# Patient Record
Sex: Female | Born: 1990 | Race: White | Hispanic: No | Marital: Single | Smoking: Never smoker
Health system: Southern US, Community
[De-identification: ages and names within clinical notes are randomized; demographics above are authoritative.]

## PROBLEM LIST (undated history)

## (undated) DIAGNOSIS — Z8619 Personal history of other infectious and parasitic diseases: Secondary | ICD-10-CM

## (undated) DIAGNOSIS — F410 Panic disorder [episodic paroxysmal anxiety] without agoraphobia: Secondary | ICD-10-CM

## (undated) DIAGNOSIS — F419 Anxiety disorder, unspecified: Secondary | ICD-10-CM

## (undated) DIAGNOSIS — R87629 Unspecified abnormal cytological findings in specimens from vagina: Secondary | ICD-10-CM

## (undated) HISTORY — DX: Unspecified abnormal cytological findings in specimens from vagina: R87.629

## (undated) HISTORY — PX: WISDOM TOOTH EXTRACTION: SHX21

## (undated) HISTORY — PX: TONSILLECTOMY: SUR1361

## (undated) HISTORY — PX: COLPOSCOPY: SHX161

## (undated) HISTORY — PX: CYSTOSCOPY W/ DILATION OF BLADDER: SUR374

## (undated) HISTORY — PX: NO PAST SURGERIES: SHX2092

---
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
## 2009-12-02 ENCOUNTER — Ambulatory Visit: Payer: Self-pay | Admitting: Pediatrics

## 2010-04-21 LAB — POCT PREGNANCY, URINE: Preg Test, Ur: NEGATIVE

## 2010-04-21 LAB — POCT HEMOGLOBIN-HEMACUE: Hemoglobin: 12.9 g/dL (ref 12.0–15.0)

## 2010-04-27 LAB — POCT I-STAT, CHEM 8
Hemoglobin: 12.9 g/dL (ref 12.0–15.0)
Potassium: 4 mEq/L (ref 3.5–5.1)
Sodium: 138 mEq/L (ref 135–145)
TCO2: 22 mmol/L (ref 0–100)

## 2010-04-27 LAB — CBC
MCHC: 33.7 g/dL (ref 30.0–36.0)
MCV: 90.5 fL (ref 78.0–100.0)
Platelets: 215 10*3/uL (ref 150–400)
WBC: 12.4 10*3/uL — ABNORMAL HIGH (ref 4.0–10.5)

## 2010-04-27 LAB — URINALYSIS, ROUTINE W REFLEX MICROSCOPIC
Nitrite: NEGATIVE
Protein, ur: 100 mg/dL — AB
Specific Gravity, Urine: 1.012 (ref 1.005–1.030)
Urobilinogen, UA: 0.2 mg/dL (ref 0.0–1.0)

## 2010-04-27 LAB — URINE CULTURE: Colony Count: >=100000

## 2010-04-27 LAB — URINE MICROSCOPIC-ADD ON

## 2010-06-03 NOTE — Consult Note (Signed)
   NAME:  Sydney Waller, Sydney Waller                            ACCOUNT NO.:  [id number]   MEDICAL RECORD NO.:  [id number]                   PATIENT TYPE:  EMS   LOCATION:  MINO                                 FACILITY:  [facility]   PHYSICIAN:  Deanna Artis. Sharene Skeans, M.D.           DATE OF BIRTH:  12-28-1990   DATE OF CONSULTATION:  06/09/2002  DATE OF DISCHARGE:                                   CONSULTATION   LABORATORY DATA:  The patient had an EKG which showed a regular sinus rhythm  of 98 beats per minute.  No sign of prolonged Q-T interval.  She had an  ISTAT with a sodium of 139, potassium 3.6, chloride 101, BUN 11, creatinine  0.7, glucose 130.  Hemoglobin 15, hematocrit 44.  pH 7.414, PCO2 33,  bicarbonate 21.  Delta base -3.  CT scan of the brain, noncontrast was  normal.  Rapid strep screening was positive.  Prolactin level was pending at  this time.  CBC:  White count 14,900, hemoglobin 13, hematocrit 37.8, MCV  85.3, platelet count 251,000, 88 polys, 7 lymphs, 5 monos.                                                Deanna Artis. Sharene Skeans, M.D.    [facility]  D:  06/09/2002  T:  06/10/2002  Job:  [id number]

## 2010-06-03 NOTE — Consult Note (Signed)
NAME:  Sydney Waller, Sydney Waller                            ACCOUNT NO.:  [id number]   MEDICAL RECORD NO.:  [id number]                   PATIENT TYPE:  EMS   LOCATION:  MINO                                 FACILITY:  [facility]   PHYSICIAN:  Deanna Artis. Sharene Skeans, M.D.           DATE OF BIRTH:  1990-07-30   DATE OF CONSULTATION:  06/09/2002  DATE OF DISCHARGE:                                   CONSULTATION   REPORT TITLE:  NEUROLOGY CONSULTATION.   REFERRING PHYSICIAN:  Trudi Ida. Denton Lank, M.D.   CHIEF COMPLAINT:  Syncope.   HISTORY OF PRESENT ILLNESS:  I was asked to see Sydney Waller for evaluation  of a syncopal episode.  She is a 20 year old right-handed Caucasian girl who  has had an episode of presyncope about a week ago in the setting of being  extremely hot outside.  She felt woozy and fortunately was lying down and  did not fully pass out.  She had a second episode this afternoon.  She  stayed home from school with a temperature of 100.6.  She had felt very  thirsty and her mother decided to wait in the car while she went into the  store to get something to drink.  While she was in the store, she felt  lightheaded, warm, and laid her head down on the check-writing shelf.  She  then pitched backwards striking her head on the counter and slumped to the  ground.  In retrospect, she had the same feeling of nausea, warmth, and  lightheadedness before nearing passing out before.   The patient has had other periods where she felt lightheaded which have  happened when she had Streptococcal pharyngitis with high fever.  These were  relatively minor in comparison with the last two episodes.  It turns out  that she has a positive Streptococcus test this time as well.   The patient has had two other quasi-neurologic problems.  The first was  orthostatic lightheadedness that occurs when she suddenly stands or  sometimes when she has stood up for awhile.  She feels a bit woozy and then  her symptoms  subside.  This more often happens when she is sick or not  feeling well.  The other is migraine without aura.  These have been present  for a little over a year and have been associated with pounding pain that is  holocephalic, nausea, occasional vomiting, sensitivity to light, but not  sound, and incapacitation.  She often is able to treat these with Advil a  dose of 200 or 400 mg.  She can take this at school or at home.  When she  awakens with it in the morning, she takes Advil, goes back to bed, and  sleeps for 3-4 hours.  When she awakens she feels better.   Mother estimates that these are happening about once per week.  Because  Advil has controlled them,  the family has not sought further care for this  problem.   FAMILY HISTORY:  Positive for migraines, positive also for seizures in the  patient's mother.  No other history of syncope that we know of.  No other  neurologic conditions.   PAST MEDICAL HISTORY:  The patient was a full-term infant.  Normal  spontaneous vaginal delivery.  Normal growth and development.   PAST SURGICAL HISTORY:  The patient had some dental surgery done, but no  major surgery.  No fractures.  No closed head injuries until today.   REVIEW OF SYSTEMS:  Remarkable only for a low grade fever and sore throat.  The patient has had multiple episodes of Streptococcal pharyngitis with  scarlet fever which has been treated not only with antibiotics, but also  with steroids with good effect.  She has never had a tonsillectomy.  The  remainder of the total system review is negative.  Immunizations are up-to-  date.   SOCIAL HISTORY:  The patient is at [employer] in the seventh  grade.  She is a very good Consulting civil engineer.  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  There is some concern in the family that these syncopal episodes  may make it difficult for her to play this summer.   PHYSICAL EXAMINATION:  GENERAL:  This is an attractive blonde-haired,  blue-  eyed pubescent child in no distress.  VITAL SIGNS:  Blood pressure 100/60, resting pulse 113, respirations 20,  temperature 99.0, pulse oximetry 99%.  HEENT:  No signs of infection.  NECK:  Supple neck, full range of motion, no cranial or cervical bruits.  LUNGS:  Clear to auscultation.  HEART:  No murmurs, pulses normal.  ABDOMEN:  Soft, nontender, bowel sounds normal.  EXTREMITIES:  Well formed without edema, cyanosis, alterations in tone, or  tight heel cords.  NEUROLOGICAL:  Mental status:  Awake, alert, attentive, appropriate, no  dysphasia or dyspraxia.  Cranial nerves:  Round reactive pupils, normal  fundi, full visual fields to double simultaneous stimuli.  Extraocular  movements full and conjugate.  OKN responses equal bilaterally.  Symmetric  facial strength and sensation.  Air conduction greater than bone conduction  bilaterally.  Motor examination, normal strength, tone, and bulk.  Fine  motor movements:  No pronator drift, sensation intact, full vibration  stereognosis.  Cerebellar examination, good finger-to-nose, rapid  alternating movements, no finger dystaxia, or dysmetria.  Gait and station  was normal.  She is able to walk on her heels and toes and perform tandem  without difficulty.  Deep tendon reflexes are symmetrically diminished.  The  patient had bilateral flexor plantar responses.   IMPRESSION:  1. Syncope, 780.2.  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  It is based on the prodrome of     nausea, heat, and lightheadedness followed by presyncope or syncope.  She     had syncope today because she stayed upright rather than lying down as     she had a week ago.  It is unlikely that we are dealing with cardiogenic     arrhythmia, and equally unlikely that this was a seizure, and unlikely     that she had a hypoglycemic episode.  All of these have their own time    courses and behaviors, none of which  were seen in the patient.  2. Orthostatic hypotension, 458.0.  This has caused her brief episodes  of     wooziness.  This is very common in a pubescent child and should go away     over time.  3. Migraine without aura, 346.10.  These episodes are frequent enough to     qualify Aviela for prophylactic medication; however, I do not think that     the family wants her on regular medication unless she has to be.   Treatment of the neurocardiogenic syncope would involve up to two quarts of  Gatorade per day (not neglecting three glasses of milk with meals).  If that  failed to prevent the episodes, then Florinef at a dose of 0.1 mg per day  would be tried.  It should be increased to b.i.d.  If that failed, Valissa  would have to have a tilt-table test at [facility]  which, if positive, would trigger either treatment with Zoloft or possibly  propranolol.  Mother has an aversion to Zoloft having tried the medication  and had adverse effects to it.    PLAN:  We will perform an EEG at our office, set up a Holter monitor at  [facility].  If necessary, we will send her to [facility] for tilt-table  test.  I have explained this to the family and I have tried to contact her  uncle, Dr. Adrian Prince, who asked me to become involved in her care.  We  will see her back in my office depending upon her clinical course.                                               Deanna Artis. Sharene Skeans, M.D.    [facility]  D:  06/09/2002  T:  06/10/2002  Job:  [id number]   cc:   Jeannett Senior A. Evlyn Kanner, M.D.  [address]  [place]  [place] [address]  Fax: [phone]   Trudi Ida. Denton Lank, M.D.  [address]  [place] [address]  Fax: [phone]

## 2010-10-18 LAB — URINE MICROSCOPIC-ADD ON

## 2010-10-18 LAB — URINALYSIS, ROUTINE W REFLEX MICROSCOPIC
Nitrite: POSITIVE — AB
Protein, ur: NEGATIVE
Urobilinogen, UA: 2 — ABNORMAL HIGH

## 2011-01-17 NOTE — L&D Delivery Note (Signed)
Delivery Note At 2:02 AM a viable and healthy female was delivered via Vaginal, Spontaneous Delivery (Presentation: Right Occiput Anterior).  APGAR: 9, 9; weight .   Placenta status: Intact, Spontaneous.  Cord: 3 vessels with the following complications: None.  Cord pH: na [place] x one reduced on perineum..  Anesthesia: Epidural Local 1% xylocaine Episiotomy: None Lacerations: 2nd degree;Periurethral and vaginal Suture Repair: 2.0 3.0 vicryl rapide Est. Blood Loss (mL): 300  Mom to postpartum.  Baby to nursery-stable.  Rondi Ivy J 09/11/2011, 2:22 AM

## 2011-02-15 LAB — OB RESULTS CONSOLE RUBELLA ANTIBODY, IGM: Rubella: IMMUNE

## 2011-02-15 LAB — OB RESULTS CONSOLE RPR: RPR: NONREACTIVE

## 2011-02-15 LAB — OB RESULTS CONSOLE HEPATITIS B SURFACE ANTIGEN: Hepatitis B Surface Ag: NEGATIVE

## 2011-03-01 ENCOUNTER — Ambulatory Visit (HOSPITAL_COMMUNITY): Admission: RE | Admit: 2011-03-01 | Payer: Self-pay | Source: Ambulatory Visit | Admitting: Obstetrics

## 2011-03-01 ENCOUNTER — Encounter (HOSPITAL_COMMUNITY): Payer: Self-pay | Admitting: Anesthesiology

## 2011-03-01 ENCOUNTER — Encounter (HOSPITAL_COMMUNITY): Admission: RE | Payer: Self-pay | Source: Ambulatory Visit

## 2011-03-03 LAB — OB RESULTS CONSOLE GC/CHLAMYDIA: Chlamydia: NEGATIVE

## 2011-04-12 IMAGING — CR DG VCUG
2 series · 2 of 2 positions shown · non-contrast
Comparison: [HOSPITAL] abdominal pelvic CT urogram
05/14/2008.

CLINICAL DATA: Recurrent urinary tract infections.  Evaluate for
possible vesicoureteral reflux.  Last menstrual period 2 weeks ago.

CYSTOGRAM
TECHNIQUE: After catheterization of the urinary bladder following
sterile technique the bladder was filled with 300 cc Cysto-Hypaque
30% by drip infusion.  Serial spot images were obtained during
bladder filling and post draining. Initial Foley catheter with
defective balloon slipped from bladder with repeat catheterization
as above.
Fluoroscopy Time: 2.3 minutes.  Pediatric technique utilized.

[view not recorded (1 of 2)]
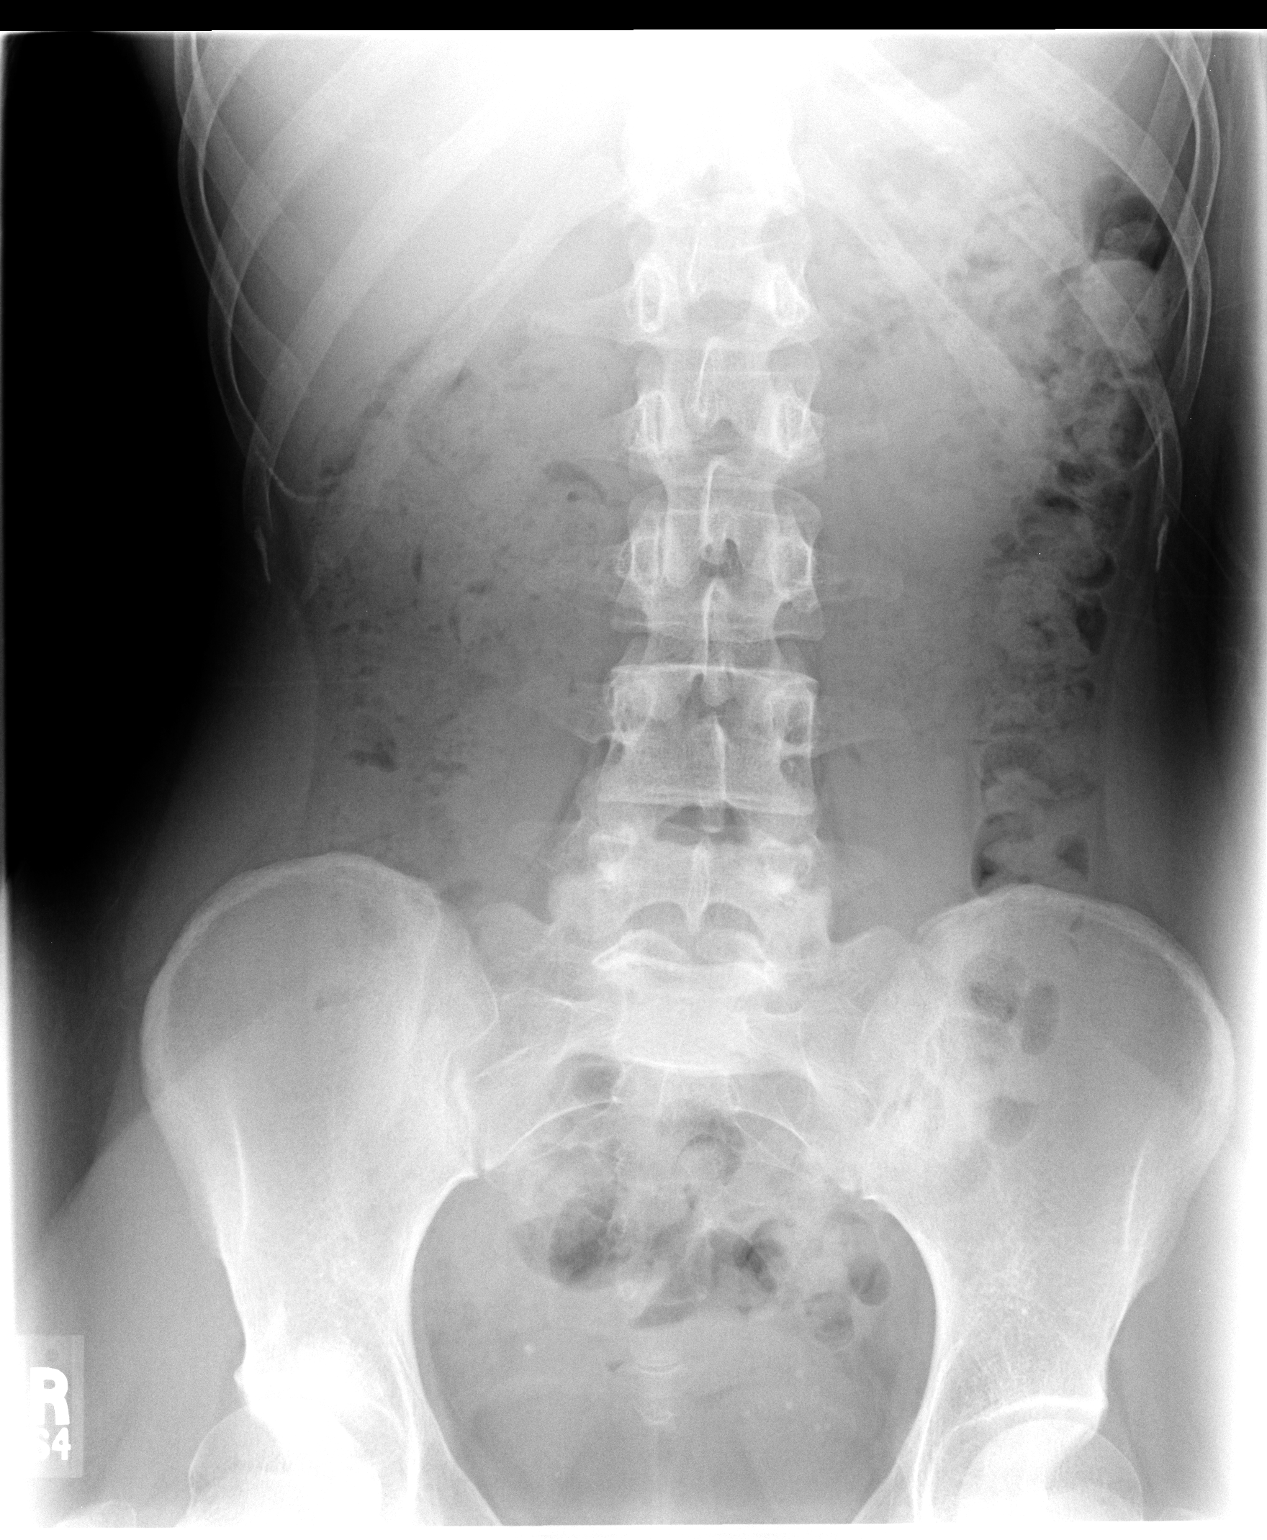

[view not recorded (2 of 2)]
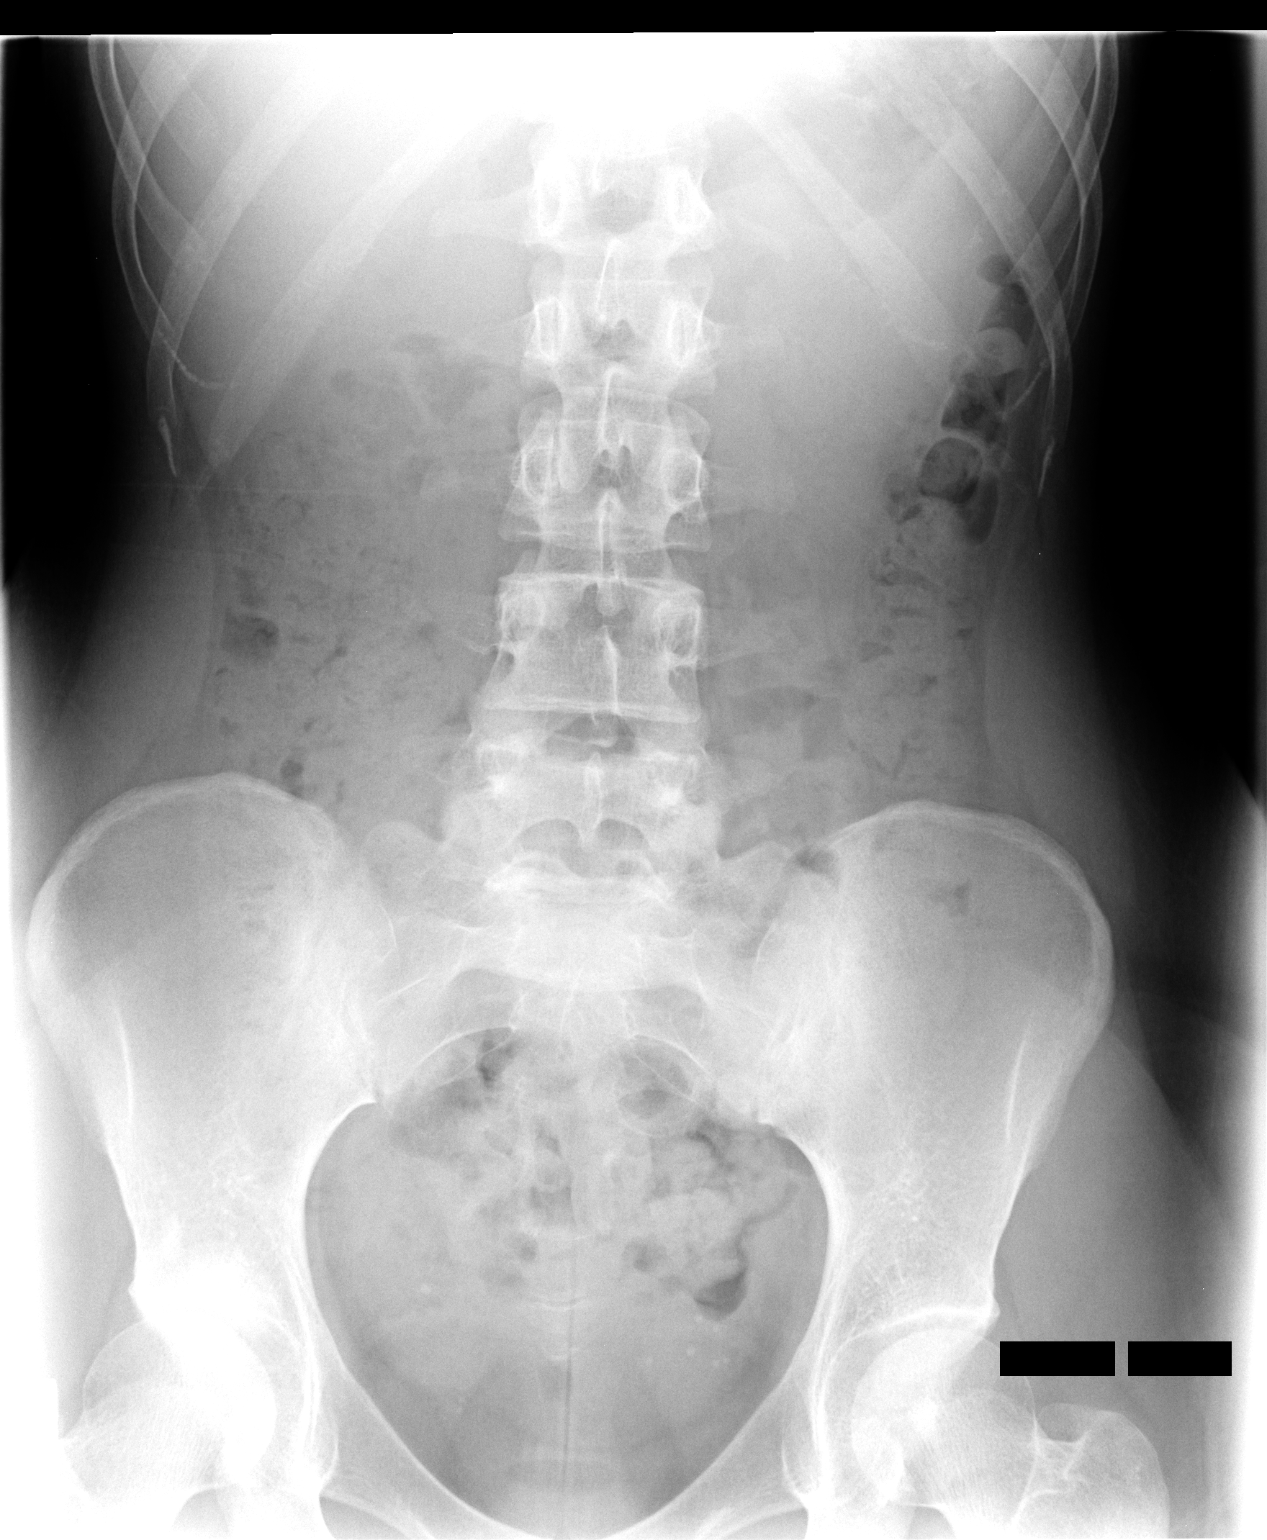

[2 of 2 positions shown; findings below may reference images not displayed]

FINDINGS: Scout view demonstrates stable moderate retained colonic
feces with normal bowel gas pattern and numerous calcified pelvic
phleboliths.  No interval urinary tract calcifications seen.
Bladder appears normal without evidence for vesicoureteral reflux.
Upon voiding the urethra appears normal.  Post void image
demonstrates no residual urinary tract contrast.
IMPRESSION: 1.  Moderate retained colonic feces may represent constipation -
need clinical correlation.
2.  Otherwise, normal.

## 2011-09-09 ENCOUNTER — Inpatient Hospital Stay (HOSPITAL_COMMUNITY)
Admission: AD | Admit: 2011-09-09 | Discharge: 2011-09-09 | Disposition: A | Discharge status: Home / Self Care | Payer: 59 | Source: Ambulatory Visit | Attending: Obstetrics and Gynecology | Admitting: Obstetrics and Gynecology

## 2011-09-09 ENCOUNTER — Encounter (HOSPITAL_COMMUNITY): Payer: Self-pay

## 2011-09-09 DIAGNOSIS — O479 False labor, unspecified: Secondary | ICD-10-CM | POA: Insufficient documentation

## 2011-09-09 NOTE — MAU Provider Note (Signed)
  History   Contractions  CSN: [id number]  Arrival date and time: 09/09/11 0227   None     Chief Complaint  Patient presents with  . Labor Eval   HPI  OB History    Grav Para Term Preterm Abortions TAB SAB Ect Mult Living   1               History reviewed. No pertinent past medical history.  Past Surgical History  Procedure Date  . Wisdom tooth extraction     History reviewed. No pertinent family history.  History  Substance Use Topics  . Smoking status: Never Smoker   . Smokeless tobacco: Not on file  . Alcohol Use: No    Allergies: No Known Allergies  Prescriptions prior to admission  Medication Sig Dispense Refill  . polyethylene glycol (MIRALAX / GLYCOLAX) packet Take 17 g by mouth daily.      . Prenatal Vit-Fe Fumarate-FA (PRENATAL MULTIVITAMIN) TABS Take 1 tablet by mouth daily.      . ranitidine (ZANTAC) 150 MG capsule Take 150 mg by mouth 2 (two) times daily.        ROS Physical Exam NCAT Lgs:CTA CV:RRR Abd: gravid, NT Neg CVAT EXT: neg c/c/e VE_ 2/70/-1 Neuro: nonfocal SkiN: intact     Blood pressure 125/68, pulse 90, temperature 97.9 F (36.6 C), temperature source Oral, resp. rate 18, height 5\' 4"  (1.626 m), weight 74.844 kg (165 lb).  Physical Exam  MAU Course  Procedures  MDM Na   Assessment and Plan  Prodromal labor Reactive NST DC home  Ronnell Makarewicz J 09/09/2011, 3:25 AM

## 2011-09-09 NOTE — MAU Note (Signed)
Patient states "I have been working all evening and have been having contractions more frequently and in my lower abdomen"

## 2011-09-10 ENCOUNTER — Inpatient Hospital Stay (HOSPITAL_COMMUNITY): Payer: 59 | Admitting: Anesthesiology

## 2011-09-10 ENCOUNTER — Encounter (HOSPITAL_COMMUNITY): Payer: Self-pay | Admitting: Anesthesiology

## 2011-09-10 ENCOUNTER — Inpatient Hospital Stay (HOSPITAL_COMMUNITY)
Admission: AD | Admit: 2011-09-10 | Discharge: 2011-09-13 | DRG: 775 | Disposition: A | Discharge status: Home / Self Care | Payer: 59 | Source: Ambulatory Visit | Attending: Obstetrics and Gynecology | Admitting: Obstetrics and Gynecology

## 2011-09-10 ENCOUNTER — Encounter (HOSPITAL_COMMUNITY): Payer: Self-pay | Admitting: *Deleted

## 2011-09-10 DIAGNOSIS — Z22330 Carrier of Group B streptococcus: Secondary | ICD-10-CM

## 2011-09-10 DIAGNOSIS — O99892 Other specified diseases and conditions complicating childbirth: Secondary | ICD-10-CM | POA: Diagnosis present

## 2011-09-10 HISTORY — DX: Panic disorder (episodic paroxysmal anxiety): F41.0

## 2011-09-10 HISTORY — DX: Personal history of other infectious and parasitic diseases: Z86.19

## 2011-09-10 HISTORY — DX: Anxiety disorder, unspecified: F41.9

## 2011-09-10 LAB — CBC
HCT: 34.6 % — ABNORMAL LOW (ref 36.0–46.0)
Hemoglobin: 11.6 g/dL — ABNORMAL LOW (ref 12.0–15.0)
MCH: 29.6 pg (ref 26.0–34.0)
MCHC: 33.5 g/dL (ref 30.0–36.0)
MCV: 88.3 fL (ref 78.0–100.0)
Platelets: 288 K/uL (ref 150–400)
RBC: 3.92 MIL/uL (ref 3.87–5.11)
RDW: 14 % (ref 11.5–15.5)
WBC: 15.3 K/uL — ABNORMAL HIGH (ref 4.0–10.5)

## 2011-09-10 MED ORDER — ONDANSETRON HCL 4 MG/2ML IJ SOLN: 4.0000 mg | Freq: Four times a day (QID) | INTRAMUSCULAR | Status: DC | PRN

## 2011-09-10 MED ORDER — LACTATED RINGERS IV SOLN: 500.0000 mL | INTRAVENOUS | Status: DC | PRN

## 2011-09-10 MED ORDER — PHENYLEPHRINE 40 MCG/ML (10ML) SYRINGE FOR IV PUSH (FOR BLOOD PRESSURE SUPPORT)
80.0000 ug | PREFILLED_SYRINGE | INTRAVENOUS | Status: DC | PRN
  Filled 2011-09-10: qty 5

## 2011-09-10 MED ORDER — EPHEDRINE 5 MG/ML INJ: 10.0000 mg | INTRAVENOUS | Status: DC | PRN

## 2011-09-10 MED ORDER — OXYTOCIN 40 UNITS IN LACTATED RINGERS INFUSION - SIMPLE MED
1.0000 m[IU]/min | INTRAVENOUS | Status: DC
  Administered 2011-09-10: 2 m[IU]/min via INTRAVENOUS
  Filled 2011-09-10: qty 1000

## 2011-09-10 MED ORDER — LIDOCAINE HCL (PF) 1 % IJ SOLN
30.0000 mL | INTRAMUSCULAR | Status: DC | PRN
  Filled 2011-09-10: qty 30

## 2011-09-10 MED ORDER — IBUPROFEN 600 MG PO TABS: 600.0000 mg | ORAL_TABLET | Freq: Four times a day (QID) | ORAL | Status: DC | PRN

## 2011-09-10 MED ORDER — CITRIC ACID-SODIUM CITRATE 334-500 MG/5ML PO SOLN: 30.0000 mL | ORAL | Status: DC | PRN

## 2011-09-10 MED ORDER — FENTANYL 2.5 MCG/ML BUPIVACAINE 1/10 % EPIDURAL INFUSION (WH - ANES)
14.0000 mL/h | INTRAMUSCULAR | Status: DC
  Administered 2011-09-11: 14 mL/h via EPIDURAL
  Filled 2011-09-10 (×2): qty 60

## 2011-09-10 MED ORDER — EPHEDRINE 5 MG/ML INJ
10.0000 mg | INTRAVENOUS | Status: DC | PRN
  Filled 2011-09-10: qty 4

## 2011-09-10 MED ORDER — DIPHENHYDRAMINE HCL 50 MG/ML IJ SOLN: 12.5000 mg | INTRAMUSCULAR | Status: DC | PRN

## 2011-09-10 MED ORDER — PHENYLEPHRINE 40 MCG/ML (10ML) SYRINGE FOR IV PUSH (FOR BLOOD PRESSURE SUPPORT): 80.0000 ug | PREFILLED_SYRINGE | INTRAVENOUS | Status: DC | PRN

## 2011-09-10 MED ORDER — OXYTOCIN BOLUS FROM INFUSION
250.0000 mL | Freq: Once | INTRAVENOUS | Status: AC
  Administered 2011-09-11: 250 mL via INTRAVENOUS
  Filled 2011-09-10: qty 500

## 2011-09-10 MED ORDER — TERBUTALINE SULFATE 1 MG/ML IJ SOLN: 0.2500 mg | Freq: Once | INTRAMUSCULAR | Status: AC | PRN

## 2011-09-10 MED ORDER — OXYCODONE-ACETAMINOPHEN 5-325 MG PO TABS: 1.0000 | ORAL_TABLET | ORAL | Status: DC | PRN

## 2011-09-10 MED ORDER — ZOLPIDEM TARTRATE 5 MG PO TABS: 5.0000 mg | ORAL_TABLET | Freq: Every evening | ORAL | Status: DC | PRN

## 2011-09-10 MED ORDER — ACETAMINOPHEN 325 MG PO TABS: 650.0000 mg | ORAL_TABLET | ORAL | Status: DC | PRN

## 2011-09-10 MED ORDER — FENTANYL 2.5 MCG/ML BUPIVACAINE 1/10 % EPIDURAL INFUSION (WH - ANES)
INTRAMUSCULAR | Status: DC | PRN
  Administered 2011-09-10: 14 mL/h via EPIDURAL

## 2011-09-10 MED ORDER — OXYTOCIN 40 UNITS IN LACTATED RINGERS INFUSION - SIMPLE MED: 62.5000 mL/h | Freq: Once | INTRAVENOUS | Status: DC

## 2011-09-10 MED ORDER — BUTORPHANOL TARTRATE 1 MG/ML IJ SOLN
1.0000 mg | INTRAMUSCULAR | Status: DC | PRN
  Administered 2011-09-10: 1 mg via INTRAVENOUS
  Filled 2011-09-10 (×2): qty 1

## 2011-09-10 MED ORDER — FLEET ENEMA 7-19 GM/118ML RE ENEM: 1.0000 | ENEMA | RECTAL | Status: DC | PRN

## 2011-09-10 MED ORDER — PENICILLIN G POTASSIUM 5000000 UNITS IJ SOLR
2.5000 10*6.[IU] | INTRAVENOUS | Status: DC
  Administered 2011-09-10: 2.5 10*6.[IU] via INTRAVENOUS
  Filled 2011-09-10 (×5): qty 2.5

## 2011-09-10 MED ORDER — PENICILLIN G POTASSIUM 5000000 UNITS IJ SOLR
5.0000 10*6.[IU] | Freq: Once | INTRAVENOUS | Status: AC
  Administered 2011-09-10: 5 10*6.[IU] via INTRAVENOUS
  Filled 2011-09-10: qty 5

## 2011-09-10 MED ORDER — LACTATED RINGERS IV SOLN
INTRAVENOUS | Status: DC
  Administered 2011-09-10 – 2011-09-11 (×2): via INTRAVENOUS

## 2011-09-10 MED ORDER — LIDOCAINE HCL (PF) 1 % IJ SOLN
INTRAMUSCULAR | Status: DC | PRN
  Administered 2011-09-10 (×2): 4 mL
  Administered 2011-09-11: 30 mL

## 2011-09-10 MED ORDER — LACTATED RINGERS IV SOLN
500.0000 mL | Freq: Once | INTRAVENOUS | Status: AC
  Administered 2011-09-10: 500 mL via INTRAVENOUS

## 2011-09-10 NOTE — MAU Note (Signed)
Pt reports her water broke about 5 pm. Clear fluid coming out. Reports mild mild contractions and good fetal movement.

## 2011-09-10 NOTE — Progress Notes (Signed)
Sydney Waller is a 21 y.o. G1P0 at [redacted]w[redacted]d by LMP admitted for rupture of membranes  Subjective: Uncomfortable  Objective: BP 105/71  Pulse 77  Temp 98.1 F (36.7 C) (Oral)  Resp 18  Ht 5\' 4"  (1.626 m)  Wt 72.938 kg (160 lb 12.8 oz)  BMI 27.60 kg/m2      FHT:  FHR: 125 bpm, variability: moderate,  accelerations:  Present,  decelerations:  Absent UC:   regular, every 3 minutes SVE:   Dilation: 4 Effacement (%): 80 Station: 0 Exam by:: dr Billy [place]  Labs: Lab Results  Component Value Date   WBC 15.3* 09/10/2011   HGB 11.6* 09/10/2011   HCT 34.6* 09/10/2011   MCV 88.3 09/10/2011   PLT 288 09/10/2011    Assessment / Plan: Augmentation of labor, progressing well GBS positive  Labor: Progressing normally Preeclampsia:  na Fetal Wellbeing:  Category I Pain Control:  Received Stadol , considering Epidural I/D:  n/a Anticipated MOD:  NSVD  Sydney Waller 09/10/2011, 9:36 PM

## 2011-09-10 NOTE — Anesthesia Preprocedure Evaluation (Signed)
Anesthesia Evaluation  Patient identified by MRN, date of birth, ID band Patient awake    Reviewed: Allergy & Precautions, H&P , Patient's Chart, lab work & pertinent test results  Airway Mallampati: II TM Distance: >3 FB Neck ROM: full    Dental No notable dental hx. (+) Teeth Intact   Pulmonary neg pulmonary ROS,  breath sounds clear to auscultation  Pulmonary exam normal       Cardiovascular negative cardio ROS  Rhythm:regular Rate:Normal     Neuro/Psych Anxiety Panic attacksnegative neurological ROS  negative psych ROS   GI/Hepatic negative GI ROS, Neg liver ROS,   Endo/Other  negative endocrine ROS  Renal/GU negative Renal ROS  negative genitourinary   Musculoskeletal   Abdominal Normal abdominal exam  (+)   Peds  Hematology negative hematology ROS (+)   Anesthesia Other Findings   Reproductive/Obstetrics (+) Pregnancy                           Anesthesia Physical Anesthesia Plan  ASA: II  Anesthesia Plan: Epidural   Post-op Pain Management:    Induction:   Airway Management Planned:   Additional Equipment:   Intra-op Plan:   Post-operative Plan:   Informed Consent: I have reviewed the patients History and Physical, chart, labs and discussed the procedure including the risks, benefits and alternatives for the proposed anesthesia with the patient or authorized representative who has indicated his/her understanding and acceptance.     Plan Discussed with: Anesthesiologist  Anesthesia Plan Comments:         Anesthesia Quick Evaluation

## 2011-09-10 NOTE — Anesthesia Procedure Notes (Signed)
Epidural Patient location during procedure: OB Start time: 09/10/2011 10:01 PM  Staffing Anesthesiologist: Stefan Karen A. Performed by: anesthesiologist   Preanesthetic Checklist Completed: patient identified, site marked, surgical consent, pre-op evaluation, timeout performed, IV checked, risks and benefits discussed and monitors and equipment checked  Epidural Patient position: sitting Prep: site prepped and draped and DuraPrep Patient monitoring: continuous pulse ox and blood pressure Approach: midline Injection technique: LOR air  Needle:  Needle type: Tuohy  Needle gauge: 17 G Needle length: 9 cm Needle insertion depth: 5 cm cm Catheter type: closed end flexible Catheter size: 19 Gauge Catheter at skin depth: 10 cm Test dose: negative and Other  Assessment Events: blood not aspirated, injection not painful, no injection resistance, negative IV test and no paresthesia  Additional Notes Patient identified. Risks and benefits discussed including failed block, incomplete  Pain control, post dural puncture headache, nerve damage, paralysis, blood pressure Changes, nausea, vomiting, reactions to medications-both toxic and allergic and post Partum back pain. All questions were answered. Patient expressed understanding and wished to proceed. Sterile technique was used throughout procedure. Epidural site was Dressed with sterile barrier dressing. No paresthesias, signs of intravascular injection Or signs of intrathecal spread were encountered.  Patient was more comfortable after the epidural was dosed. Please see RN's note for documentation of vital signs and FHR which are stable.

## 2011-09-10 NOTE — Progress Notes (Signed)
Sydney Waller is a 21 y.o. G1P0 at [redacted]w[redacted]d by LMP admitted for rupture of membranes  Subjective: SROM  Objective: BP 122/67  Pulse 86  Temp 98.1 F (36.7 C) (Oral)  Resp 18  Ht 5\' 4"  (1.626 m)  Wt 72.938 kg (160 lb 12.8 oz)  BMI 27.60 kg/m2      FHT:  FHR: 135 bpm, variability: moderate,  accelerations:  Present,  decelerations:  Absent UC:   irregular, every 10 minutes SVE:    2/70 -1 Labs: Lab Results  Component Value Date   WBC 12.4* 05/14/2008   HGB 12.9 10/23/2008   HCT 38.0 05/14/2008   MCV 90.5 05/14/2008   PLT 215 05/14/2008    Assessment / Plan: SROM at term with GBS positive  Labor: augment Preeclampsia:  na Fetal Wellbeing:  Category I Pain Control:  Labor support without medications I/D:  n/a Anticipated MOD:  NSVD  Sydney Waller J 09/10/2011, 6:55 PM

## 2011-09-11 ENCOUNTER — Encounter (HOSPITAL_COMMUNITY): Payer: Self-pay

## 2011-09-11 LAB — CBC
HCT: 31.3 % — ABNORMAL LOW (ref 36.0–46.0)
MCH: 29.6 pg (ref 26.0–34.0)
MCHC: 33.2 g/dL (ref 30.0–36.0)
MCV: 89.2 fL (ref 78.0–100.0)
RDW: 14.1 % (ref 11.5–15.5)

## 2011-09-11 MED ORDER — ONDANSETRON HCL 4 MG/2ML IJ SOLN: 4.0000 mg | INTRAMUSCULAR | Status: DC | PRN

## 2011-09-11 MED ORDER — BENZOCAINE-MENTHOL 20-0.5 % EX AERO
1.0000 "application " | INHALATION_SPRAY | CUTANEOUS | Status: DC | PRN
  Administered 2011-09-11: 1 via TOPICAL
  Filled 2011-09-11: qty 56

## 2011-09-11 MED ORDER — DIBUCAINE 1 % RE OINT: 1.0000 "application " | TOPICAL_OINTMENT | RECTAL | Status: DC | PRN

## 2011-09-11 MED ORDER — METHYLERGONOVINE MALEATE 0.2 MG PO TABS: 0.2000 mg | ORAL_TABLET | ORAL | Status: DC | PRN

## 2011-09-11 MED ORDER — LANOLIN HYDROUS EX OINT: TOPICAL_OINTMENT | CUTANEOUS | Status: DC | PRN

## 2011-09-11 MED ORDER — ZOLPIDEM TARTRATE 5 MG PO TABS: 5.0000 mg | ORAL_TABLET | Freq: Every evening | ORAL | Status: DC | PRN

## 2011-09-11 MED ORDER — SENNOSIDES-DOCUSATE SODIUM 8.6-50 MG PO TABS
2.0000 | ORAL_TABLET | Freq: Every day | ORAL | Status: DC
  Administered 2011-09-11 – 2011-09-12 (×2): 2 via ORAL

## 2011-09-11 MED ORDER — SIMETHICONE 80 MG PO CHEW: 80.0000 mg | CHEWABLE_TABLET | ORAL | Status: DC | PRN

## 2011-09-11 MED ORDER — OXYCODONE-ACETAMINOPHEN 5-325 MG PO TABS
1.0000 | ORAL_TABLET | ORAL | Status: DC | PRN
  Administered 2011-09-11 – 2011-09-13 (×8): 1 via ORAL
  Filled 2011-09-11 (×9): qty 1

## 2011-09-11 MED ORDER — PRENATAL MULTIVITAMIN CH
1.0000 | ORAL_TABLET | Freq: Every day | ORAL | Status: DC
  Administered 2011-09-11 – 2011-09-13 (×3): 1 via ORAL
  Filled 2011-09-11 (×5): qty 1

## 2011-09-11 MED ORDER — WITCH HAZEL-GLYCERIN EX PADS: 1.0000 "application " | MEDICATED_PAD | CUTANEOUS | Status: DC | PRN

## 2011-09-11 MED ORDER — ONDANSETRON HCL 4 MG PO TABS: 4.0000 mg | ORAL_TABLET | ORAL | Status: DC | PRN

## 2011-09-11 MED ORDER — DIPHENHYDRAMINE HCL 25 MG PO CAPS: 25.0000 mg | ORAL_CAPSULE | Freq: Four times a day (QID) | ORAL | Status: DC | PRN

## 2011-09-11 MED ORDER — IBUPROFEN 600 MG PO TABS
600.0000 mg | ORAL_TABLET | Freq: Four times a day (QID) | ORAL | Status: DC
  Administered 2011-09-11 – 2011-09-13 (×9): 600 mg via ORAL
  Filled 2011-09-11 (×10): qty 1

## 2011-09-11 MED ORDER — METHYLERGONOVINE MALEATE 0.2 MG/ML IJ SOLN
0.2000 mg | INTRAMUSCULAR | Status: DC | PRN
Start: 2011-09-11 — End: 2011-09-13

## 2011-09-11 MED ORDER — TETANUS-DIPHTH-ACELL PERTUSSIS 5-2.5-18.5 LF-MCG/0.5 IM SUSP
0.5000 mL | Freq: Once | INTRAMUSCULAR | Status: AC
  Administered 2011-09-13: 0.5 mL via INTRAMUSCULAR
  Filled 2011-09-11: qty 0.5

## 2011-09-11 NOTE — Anesthesia Postprocedure Evaluation (Signed)
  Anesthesia Post-op Note  Patient: Sydney Waller  Procedure(s) Performed: * No procedures listed *  Patient Location: PACU and Mother/Baby  Anesthesia Type: Epidural  Level of Consciousness: awake  Airway and Oxygen Therapy: Patient Spontanous Breathing  Post-op Pain: none  Post-op Assessment: Patient's Cardiovascular Status Stable, Respiratory Function Stable, Patent Airway, No signs of Nausea or vomiting, Adequate PO intake, Pain level controlled, No headache, No backache, No residual numbness and No residual motor weakness  Post-op Vital Signs: Reviewed and stable  Complications: No apparent anesthesia complications

## 2011-09-11 NOTE — H&P (Signed)
Sydney Waller, [place]                ACCOUNT NO.:  [id number]  MEDICAL RECORD NO.:  [id number]  LOCATION:  9164                          FACILITY:  [facility]  PHYSICIAN:  Lenoard Aden, M.D.DATE OF BIRTH:  09-21-1990  DATE OF ADMISSION:  09/10/2011 DATE OF DISCHARGE:                             HISTORY & PHYSICAL   CHIEF COMPLAINT:  Spontaneous rupture of membranes.  HISTORY OF PRESENT ILLNESS:  She is a 21 year old white female, G1, P0 at 37 weeks and 6 days' gestation, who presents with spontaneous rupture of membranes.  ALLERGIES:  She has no known drug allergies.  MEDICATIONS:  Prenatal vitamins and MiraLax as needed.  She denies any domestic or physical violence.  FAMILY HISTORY:  Chronic hypertension.  PAST SURGICAL HISTORY:  Remarkable for cystoscopy and tonsillectomy. Prenatal course was complicated by GBS positivity.  PHYSICAL EXAMINATION:  GENERAL:  She is a well-developed, well- nourished, white female, in no acute distress. HEENT:  Normal. NECK:  Supple.  Full range of motion. LUNGS:  Clear. HEART:  Regular rate and rhythm. ABDOMEN:  Soft, gravid, nontender.  Estimated fetal weight is 6.5-7 pounds.  Cervix pen RN is 2-3 cm, 100%, vertex, -1. EXTREMITIES:  There are no cords. NEUROLOGIC:  Nonfocal. SKIN:  Intact.  NST is reactive.  IMPRESSION:  Term intrauterine pregnancy with spontaneous rupture of membranes.  GBS positive.  PLAN:  At this time, Pitocin augmentation, epidural as needed.     Lenoard Aden, M.D.     [facility]  D:  09/10/2011  T:  09/11/2011  Job:  [id number]

## 2011-09-11 NOTE — Progress Notes (Signed)
Ur chart review completed.  

## 2011-09-12 NOTE — Clinical Social Work Psychosocial (Signed)
    Clinical Social Work Department BRIEF PSYCHOSOCIAL ASSESSMENT 09/12/2011  Patient:  Sydney Waller, Sydney Waller     Account Number:  [id number]     Admit date:  09/10/2011  Clinical Social Worker:  Andy Gauss  Date/Time:  09/12/2011 11:10 AM  Referred by:  Physician  Date Referred:  09/11/2011 Referred for  Behavioral Health Issues   Other Referral:   Hx of anxiety   Interview type:  Patient Other interview type:    PSYCHOSOCIAL DATA Living Status:  FAMILY Admitted from facility:   Level of care:   Primary support name:  Kem Kays Primary support relationship to patient:  PARENT Degree of support available:   Involved    CURRENT CONCERNS Current Concerns  Behavioral Health Issues   Other Concerns:    SOCIAL WORK ASSESSMENT / PLAN Sw referral received to assess pt's hx of anxiety.  Pt acknowledges that she experienced anxious symptoms 2 years ago.  She was prescribed Ativan, of which she took PRN. She has not taken any medication since 2/12 and reports feeling fine without medication.  She denies any depression or SI hx.  Pt's mom and FOB at the bedside and supportive. Pt appears to be bonding well with the infant and appropriate.  Sw discussed the risk of PP depression and encouraged her to seek medical attention if needed.  Sw available to assist further if needed.   Assessment/plan status:  No Further Intervention Required Other assessment/ plan:   Information/referral to community resources:   Pt will seek medical attention if PP depression symptoms arise.    PATIENT'S/FAMILY'S RESPONSE TO PLAN OF CARE: Pt and family were appreciative of Sw consult.

## 2011-09-12 NOTE — Progress Notes (Signed)
PPD 1 SVD  S:  Reports feeling well - just tired             Tolerating po/ No nausea or vomiting             Bleeding is light             Pain controlled with motrin and percocet             Up ad lib / ambulatory  Newborn breast feeding  / female newborn   O:  A & O x 3              VS: Blood pressure 113/67, pulse 60, temperature 97.5 F (36.4 C), temperature source Oral, resp. rate 18, height 5\' 4"  (1.626 m), weight 72.938 kg (160 lb 12.8 oz), SpO2 97.00%, unknown if currently breastfeeding.  Lungs: Clear and unlabored  Heart: regular rate and rhythm  Abdomen: soft, non-tender, non-distended              Fundus: firm, non-tender, U-1  Perineum: mild edema  Lochia: moderate  Extremities: no edema, no calf pain or tenderness    A: PPD # 1   Doing well - stable status  P:  Routine post partum orders    Marlinda Mike CNM, MSN 09/12/2011, 9:50 AM

## 2011-09-13 MED ORDER — IBUPROFEN 600 MG PO TABS: 600.0000 mg | ORAL_TABLET | Freq: Four times a day (QID) | ORAL | Status: AC

## 2011-09-13 MED ORDER — BREAST PUMP MISC: 1.0000 [IU] | Status: DC | PRN

## 2011-09-13 MED ORDER — OXYCODONE-ACETAMINOPHEN 5-325 MG PO TABS
1.0000 | ORAL_TABLET | ORAL | Status: AC | PRN
Start: 2011-09-13 — End: 2011-09-23

## 2011-09-13 NOTE — Progress Notes (Signed)
Post Partum Day #2            Information for the patient's newborn:  [place], Girl Julianne [[id number]]  female    Feeding: breast  Subjective: No HA, SOB, CP, F/C, breast symptoms. Pain controlled w/ Motrin and rare Percocet. Normal vaginal bleeding, no clots.    Single mom, good family support.  Objective:  Temp:  [97.8 F (36.6 C)-98.5 F (36.9 C)] 98.4 F (36.9 C) (08/28 0542) Pulse Rate:  [60-70] 60  (08/28 0542) Resp:  [18] 18  (08/28 0542) BP: (96-118)/(57-82) 96/57 mmHg (08/28 0542)  No intake or output data in the 24 hours ending 09/13/11 0941     Basename 09/11/11 0515 09/10/11 1910  WBC 24.5* 15.3*  HGB 10.4* 11.6*  HCT 31.3* 34.6*  PLT 273 288    Blood type: A/Positive/-- (01/30 0000) Rubella: Immune (01/30 0000)    Physical Exam:  General: alert, cooperative and no distress Uterine Fundus: firm Lochia: appropriate Perineum: repair intact, edema none DVT Evaluation: Negative Homan's sign. No significant calf/ankle edema.    Assessment/Plan: PPD # 2 / 21 y.o., G1P1001 S/P:spontaneous vaginal   Active Problems:  Postpartum care following vaginal delivery (8/26)    normal postpartum exam  Continue current postpartum care  Reportable signs for PPD reviewed  D/C home   LOS: 3 days   PAUL,DANIELA, CNM, MSN 09/13/2011, 9:41 AM

## 2011-09-13 NOTE — Discharge Summary (Signed)
Obstetric Discharge Summary Reason for Admission: onset of labor and rupture of membranes Prenatal Procedures: ultrasound Intrapartum Procedures: spontaneous vaginal delivery and GBS prophylaxis Postpartum Procedures: TDaP vaccine Complications-Operative and Postpartum: 2nd degree perineal laceration, periurethral, vaginal Hemoglobin  Date Value Range Status  09/11/2011 10.4* 12.0 - 15.0 g/dL Final     HCT  Date Value Range Status  09/11/2011 31.3* 36.0 - 46.0 % Final    Physical Exam:  General: alert, cooperative and no distress Lochia: appropriate Uterine Fundus: firm Incision: healing well DVT Evaluation: Negative Homan's sign. No significant calf/ankle edema.  Discharge Diagnoses: Term Pregnancy-delivered  Discharge Information: Date: 09/13/2011 Activity: pelvic rest Diet: routine Medications: PNV, Ibuprofen and Percocet Condition: stable Instructions: refer to practice specific booklet Discharge to: home Follow-up Information    Follow up with Lenoard Aden, MD. Schedule an appointment as soon as possible for a visit in 6 weeks.   Contact information:   [address] [place] [address] [phone]          Newborn Data: Live born female "Chloe" Birth Weight: 6 lb 12.3 oz (3070 g) APGAR: 9, 9  Home with mother.  PAUL,DANIELA 09/13/2011, 9:45 AM

## 2012-06-14 IMAGING — US ABDOMEN ULTRASOUND
1 series · 17 of 25 positions shown · non-contrast
Comparison: Prior Abdominal Ultrasound of 10/23/2007.

REASON FOR EXAM: recurrent abd pain
COMMENTS:

PROCEDURE:     US  - US ABDOMEN GENERAL SURVEY  - December 02, 2009  [DATE]
RESULT:
HISTORY: Recurrent abdominal pain.

[Series 1: abdomen ultrasound · 17 of 80 slices shown]
[im 1/80]
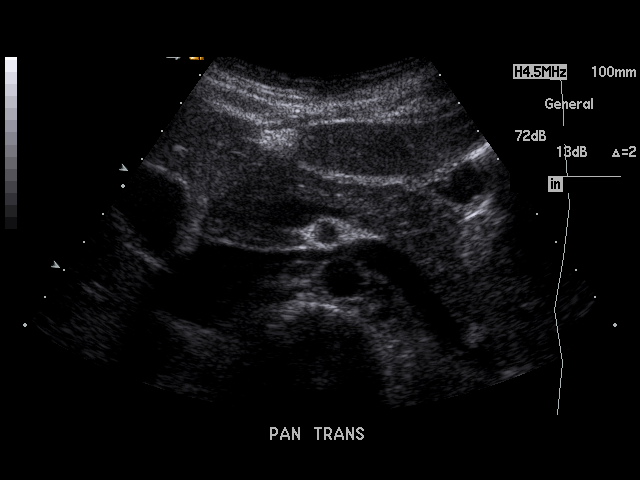
[im 7/80]
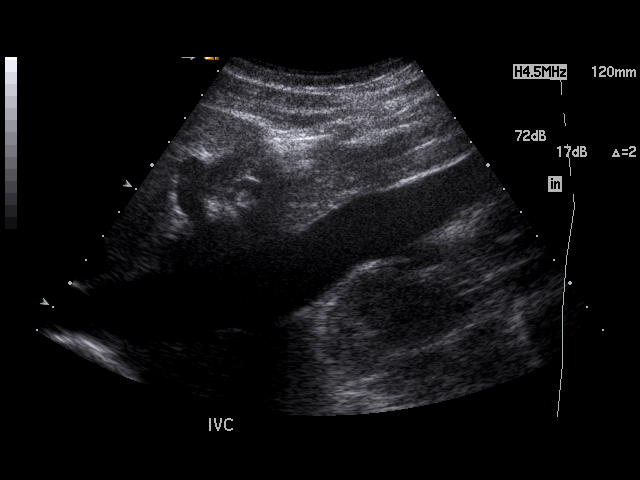
[im 10/80]
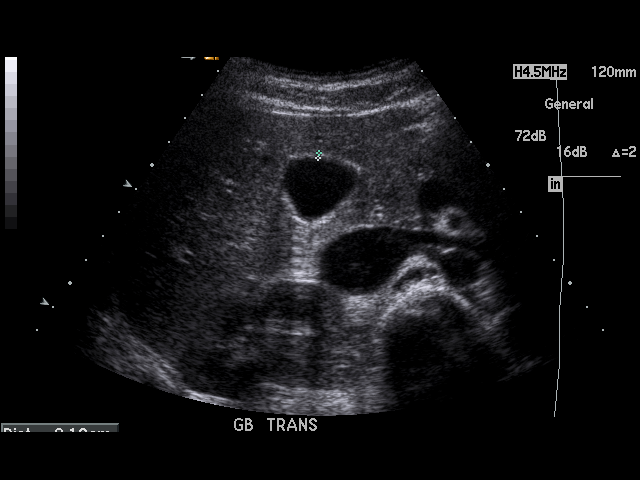
[im 17/80]
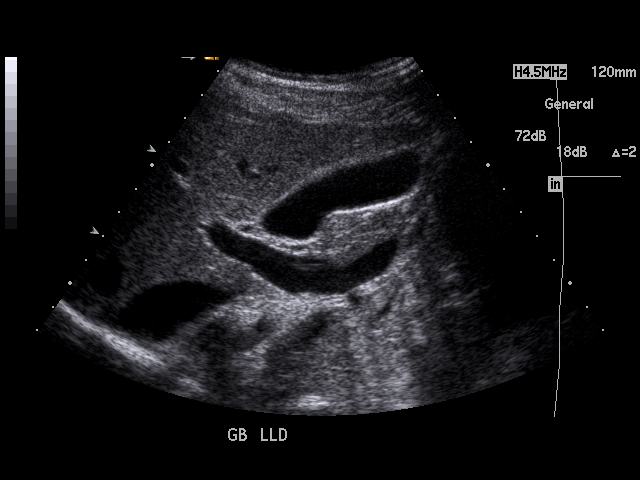
[im 20/80]
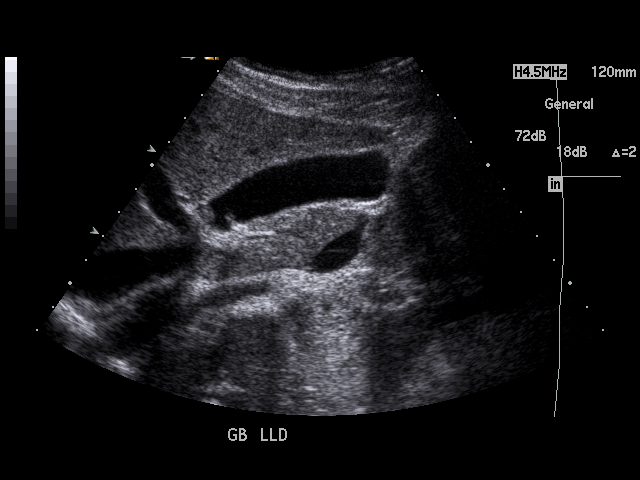
[im 27/80]
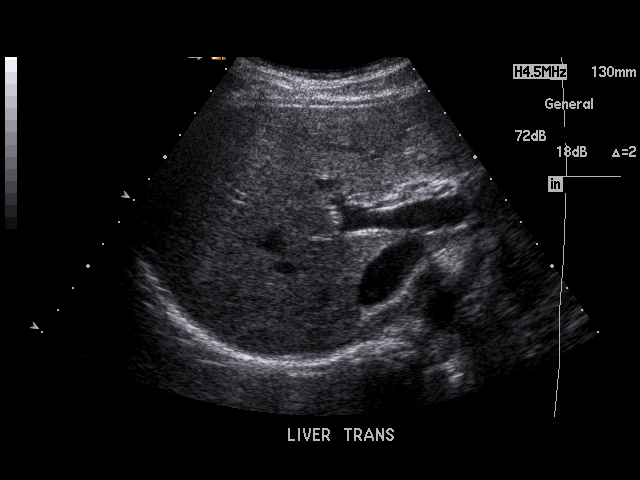
[im 30/80]
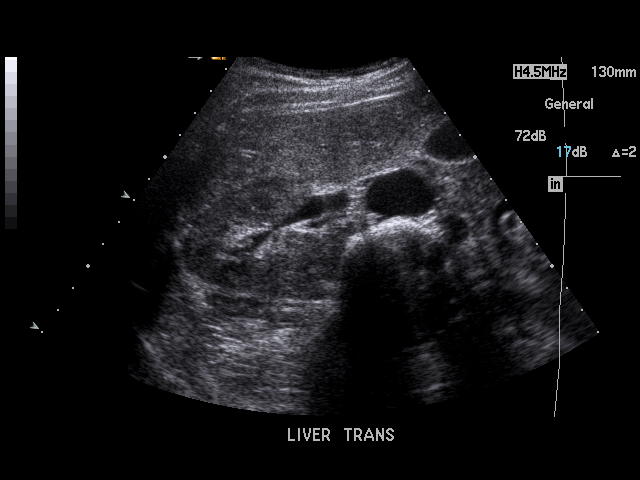
[im 37/80]
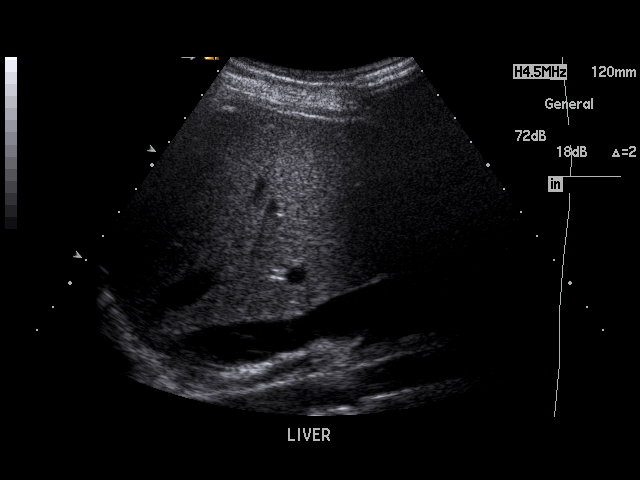
[im 40/80]
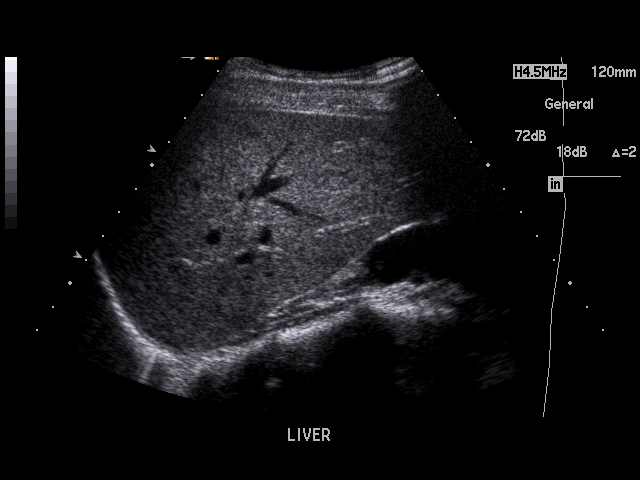
[im 43/80]
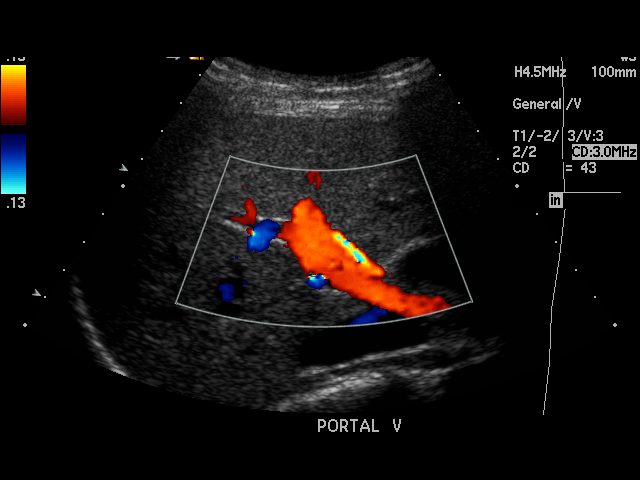
[im 50/80]
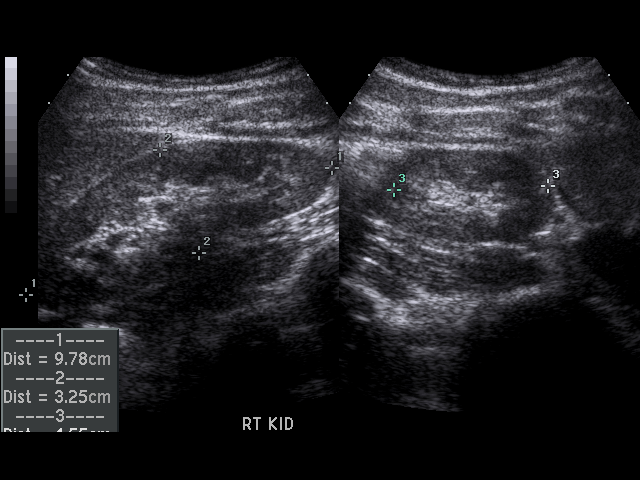
[im 53/80]
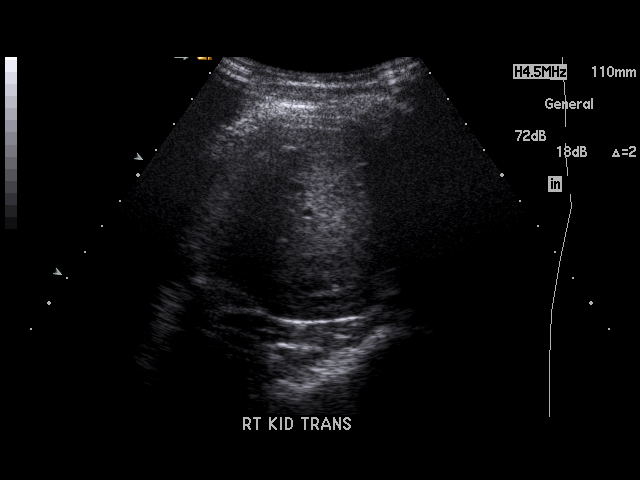
[im 60/80]
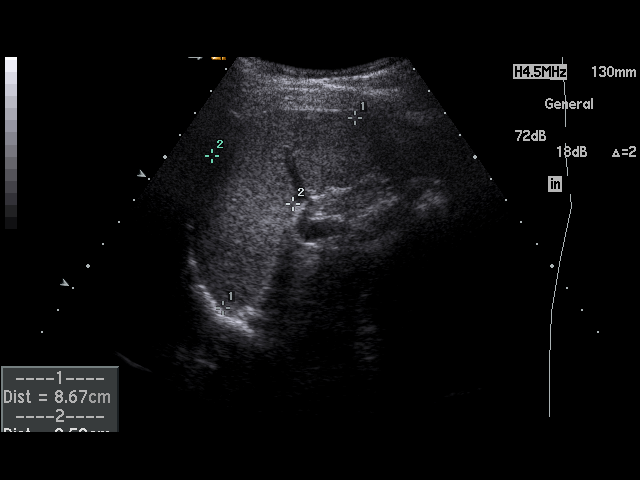
[im 63/80]
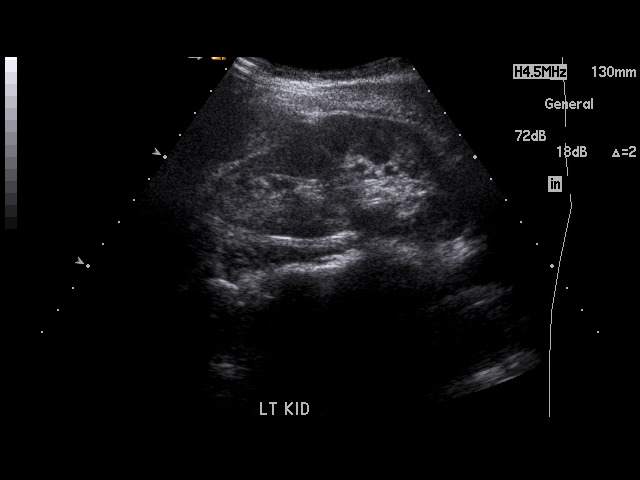
[im 70/80]
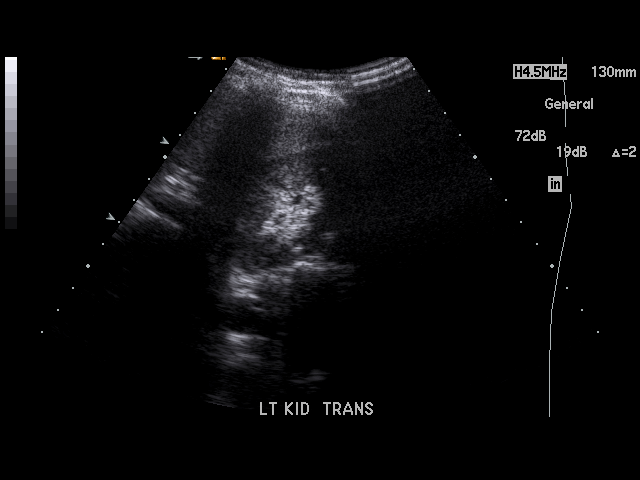
[im 73/80]
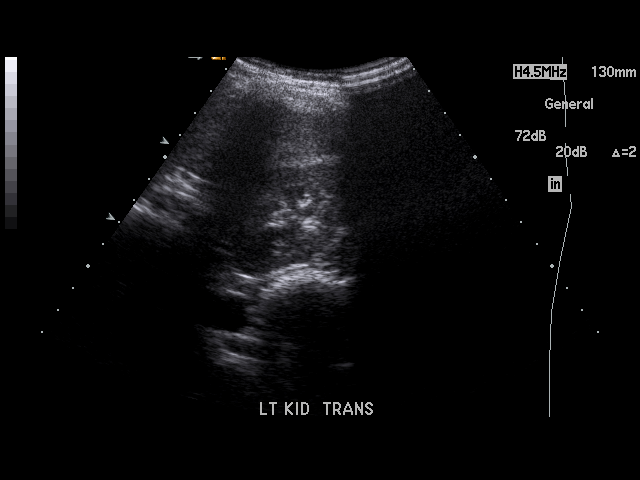
[im 80/80]
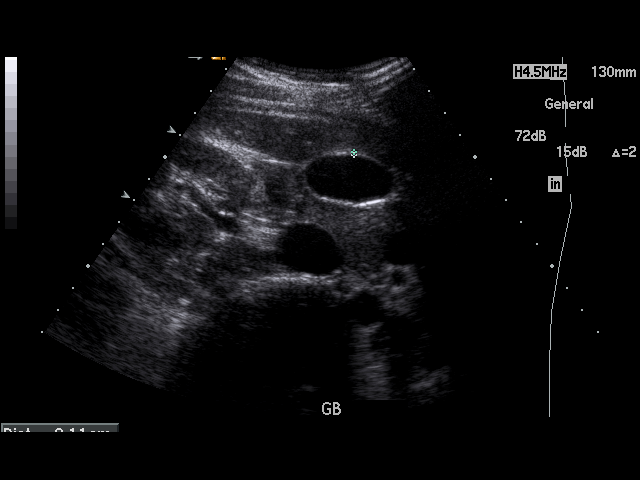

[17 of 25 positions shown; findings below may reference images not displayed]

FINDINGS: The liver is normal. The portal vein is patent. The pancreas is
normal. The gallbladder is normal. Gallbladder wall thickness is 1.8 mm. The
common bile duct diameter is 3.4 mm. There is no hydronephrosis.
IMPRESSION: Negative exam.

## 2013-11-17 ENCOUNTER — Encounter (HOSPITAL_COMMUNITY): Payer: Self-pay

## 2015-01-17 NOTE — L&D Delivery Note (Signed)
Delivery Note At 4:32 PM a viable and healthy female was delivered via Vaginal, Spontaneous Delivery (Presentation: ; Occiput Anterior).  APGAR:9 ,9 ; weight pending .   Placenta status: Intact, Spontaneous.  Cord: 3 vessels with the following complications: None.  Cord pH: na  Anesthesia: Epidural  Episiotomy: None Lacerations: Periurethral Suture Repair: 3.0 vicryl rapide Est. Blood Loss (mL): 150  Mom to postpartum.  Baby to Couplet care / Skin to Skin. NICU in attendance due to anomalies.  Rudine Rieger J 08/03/2015, 4:50 PM

## 2015-03-11 LAB — OB RESULTS CONSOLE GC/CHLAMYDIA
Chlamydia: NEGATIVE
GC PROBE AMP, GENITAL: NEGATIVE

## 2015-03-30 ENCOUNTER — Ambulatory Visit
Admission: RE | Admit: 2015-03-30 | Discharge: 2015-03-30 | Disposition: A | Discharge status: Home / Self Care | Payer: Managed Care, Other (non HMO) | Source: Ambulatory Visit | Attending: Obstetrics and Gynecology | Admitting: Obstetrics and Gynecology

## 2015-03-30 ENCOUNTER — Other Ambulatory Visit: Payer: Self-pay | Admitting: Obstetrics and Gynecology

## 2015-03-30 DIAGNOSIS — R0602 Shortness of breath: Secondary | ICD-10-CM

## 2015-07-08 LAB — OB RESULTS CONSOLE GBS: STREP GROUP B AG: POSITIVE

## 2015-07-27 ENCOUNTER — Telehealth (HOSPITAL_COMMUNITY): Payer: Self-pay | Admitting: *Deleted

## 2015-07-27 ENCOUNTER — Encounter (HOSPITAL_COMMUNITY): Payer: Self-pay | Admitting: *Deleted

## 2015-07-27 ENCOUNTER — Other Ambulatory Visit: Payer: Self-pay | Admitting: Obstetrics and Gynecology

## 2015-07-27 NOTE — Telephone Encounter (Signed)
Preadmission screen  

## 2015-07-28 ENCOUNTER — Encounter (HOSPITAL_COMMUNITY): Payer: Self-pay | Admitting: *Deleted

## 2015-08-03 ENCOUNTER — Inpatient Hospital Stay (HOSPITAL_COMMUNITY)
Admission: RE | Admit: 2015-08-03 | Discharge: 2015-08-05 | DRG: 775 | Disposition: A | Discharge status: Home / Self Care | Payer: Managed Care, Other (non HMO) | Source: Ambulatory Visit | Attending: Obstetrics and Gynecology | Admitting: Obstetrics and Gynecology

## 2015-08-03 ENCOUNTER — Encounter (HOSPITAL_COMMUNITY): Payer: Self-pay

## 2015-08-03 ENCOUNTER — Inpatient Hospital Stay (HOSPITAL_COMMUNITY): Payer: Managed Care, Other (non HMO) | Admitting: Anesthesiology

## 2015-08-03 DIAGNOSIS — O358XX0 Maternal care for other (suspected) fetal abnormality and damage, not applicable or unspecified: Secondary | ICD-10-CM | POA: Diagnosis present

## 2015-08-03 DIAGNOSIS — O9081 Anemia of the puerperium: Secondary | ICD-10-CM | POA: Diagnosis present

## 2015-08-03 DIAGNOSIS — O99824 Streptococcus B carrier state complicating childbirth: Secondary | ICD-10-CM | POA: Diagnosis present

## 2015-08-03 DIAGNOSIS — Z3A39 39 weeks gestation of pregnancy: Secondary | ICD-10-CM | POA: Diagnosis not present

## 2015-08-03 DIAGNOSIS — D62 Acute posthemorrhagic anemia: Secondary | ICD-10-CM | POA: Diagnosis present

## 2015-08-03 DIAGNOSIS — IMO0002 Reserved for concepts with insufficient information to code with codable children: Secondary | ICD-10-CM

## 2015-08-03 DIAGNOSIS — O365930 Maternal care for other known or suspected poor fetal growth, third trimester, not applicable or unspecified: Secondary | ICD-10-CM | POA: Diagnosis present

## 2015-08-03 LAB — RAPID HIV SCREEN (HIV 1/2 AB+AG)
HIV 1/2 Antibodies: NONREACTIVE
HIV-1 P24 Antigen - HIV24: NONREACTIVE

## 2015-08-03 LAB — TYPE AND SCREEN
ABO/RH(D): A POS
Antibody Screen: NEGATIVE

## 2015-08-03 LAB — ABO/RH: ABO/RH(D): A POS

## 2015-08-03 LAB — CBC
HCT: 33.7 % — ABNORMAL LOW (ref 36.0–46.0)
Hemoglobin: 11.1 g/dL — ABNORMAL LOW (ref 12.0–15.0)
MCH: 28.8 pg (ref 26.0–34.0)
MCHC: 32.9 g/dL (ref 30.0–36.0)
MCV: 87.5 fL (ref 78.0–100.0)
PLATELETS: 304 10*3/uL (ref 150–400)
RBC: 3.85 MIL/uL — AB (ref 3.87–5.11)
RDW: 14.5 % (ref 11.5–15.5)
WBC: 15.1 10*3/uL — AB (ref 4.0–10.5)

## 2015-08-03 LAB — RPR: RPR Ser Ql: NONREACTIVE

## 2015-08-03 MED ORDER — LIDOCAINE HCL (PF) 1 % IJ SOLN
30.0000 mL | INTRAMUSCULAR | Status: DC | PRN
  Filled 2015-08-03: qty 30

## 2015-08-03 MED ORDER — WITCH HAZEL-GLYCERIN EX PADS: 1.0000 "application " | MEDICATED_PAD | CUTANEOUS | Status: DC | PRN

## 2015-08-03 MED ORDER — DIPHENHYDRAMINE HCL 25 MG PO CAPS: 25.0000 mg | ORAL_CAPSULE | Freq: Four times a day (QID) | ORAL | Status: DC | PRN

## 2015-08-03 MED ORDER — LACTATED RINGERS IV SOLN: 500.0000 mL | INTRAVENOUS | Status: DC | PRN

## 2015-08-03 MED ORDER — LACTATED RINGERS IV SOLN: 500.0000 mL | Freq: Once | INTRAVENOUS | Status: DC

## 2015-08-03 MED ORDER — FENTANYL 2.5 MCG/ML BUPIVACAINE 1/10 % EPIDURAL INFUSION (WH - ANES)
14.0000 mL/h | INTRAMUSCULAR | Status: DC | PRN
  Administered 2015-08-03: 14 mL/h via EPIDURAL
  Filled 2015-08-03: qty 125

## 2015-08-03 MED ORDER — IBUPROFEN 600 MG PO TABS
600.0000 mg | ORAL_TABLET | Freq: Four times a day (QID) | ORAL | Status: DC
  Administered 2015-08-04 – 2015-08-05 (×6): 600 mg via ORAL
  Filled 2015-08-03 (×5): qty 1

## 2015-08-03 MED ORDER — ZOLPIDEM TARTRATE 5 MG PO TABS: 5.0000 mg | ORAL_TABLET | Freq: Every evening | ORAL | Status: DC | PRN

## 2015-08-03 MED ORDER — DIPHENHYDRAMINE HCL 50 MG/ML IJ SOLN: 12.5000 mg | INTRAMUSCULAR | Status: DC | PRN

## 2015-08-03 MED ORDER — FLEET ENEMA 7-19 GM/118ML RE ENEM: 1.0000 | ENEMA | RECTAL | Status: DC | PRN

## 2015-08-03 MED ORDER — SIMETHICONE 80 MG PO CHEW: 80.0000 mg | CHEWABLE_TABLET | ORAL | Status: DC | PRN

## 2015-08-03 MED ORDER — DEXTROSE 5 % IV SOLN
5.0000 10*6.[IU] | Freq: Once | INTRAVENOUS | Status: AC
  Administered 2015-08-03: 5 10*6.[IU] via INTRAVENOUS
  Filled 2015-08-03: qty 5

## 2015-08-03 MED ORDER — OXYTOCIN 40 UNITS IN LACTATED RINGERS INFUSION - SIMPLE MED
1.0000 m[IU]/min | INTRAVENOUS | Status: DC
  Administered 2015-08-03: 2 m[IU]/min via INTRAVENOUS
  Filled 2015-08-03: qty 1000

## 2015-08-03 MED ORDER — METHYLERGONOVINE MALEATE 0.2 MG/ML IJ SOLN: 0.2000 mg | INTRAMUSCULAR | Status: DC | PRN

## 2015-08-03 MED ORDER — METHYLERGONOVINE MALEATE 0.2 MG PO TABS: 0.2000 mg | ORAL_TABLET | ORAL | Status: DC | PRN

## 2015-08-03 MED ORDER — SOD CITRATE-CITRIC ACID 500-334 MG/5ML PO SOLN
30.0000 mL | ORAL | Status: DC | PRN
  Administered 2015-08-03: 30 mL via ORAL
  Filled 2015-08-03: qty 15

## 2015-08-03 MED ORDER — ONDANSETRON HCL 4 MG/2ML IJ SOLN
4.0000 mg | Freq: Four times a day (QID) | INTRAMUSCULAR | Status: DC | PRN
  Administered 2015-08-03: 4 mg via INTRAVENOUS
  Filled 2015-08-03: qty 2

## 2015-08-03 MED ORDER — OXYCODONE-ACETAMINOPHEN 5-325 MG PO TABS: 1.0000 | ORAL_TABLET | ORAL | Status: DC | PRN

## 2015-08-03 MED ORDER — ACETAMINOPHEN 325 MG PO TABS
650.0000 mg | ORAL_TABLET | ORAL | Status: DC | PRN
  Administered 2015-08-04: 650 mg via ORAL
  Filled 2015-08-03: qty 2

## 2015-08-03 MED ORDER — OXYCODONE-ACETAMINOPHEN 5-325 MG PO TABS: 2.0000 | ORAL_TABLET | ORAL | Status: DC | PRN

## 2015-08-03 MED ORDER — COCONUT OIL OIL: 1.0000 "application " | TOPICAL_OIL | Status: DC | PRN

## 2015-08-03 MED ORDER — DIBUCAINE 1 % RE OINT: 1.0000 "application " | TOPICAL_OINTMENT | RECTAL | Status: DC | PRN

## 2015-08-03 MED ORDER — TERBUTALINE SULFATE 1 MG/ML IJ SOLN
0.2500 mg | Freq: Once | INTRAMUSCULAR | Status: DC | PRN
  Filled 2015-08-03: qty 1

## 2015-08-03 MED ORDER — SENNOSIDES-DOCUSATE SODIUM 8.6-50 MG PO TABS
2.0000 | ORAL_TABLET | ORAL | Status: DC
  Administered 2015-08-04 – 2015-08-05 (×2): 2 via ORAL
  Filled 2015-08-03 (×2): qty 2

## 2015-08-03 MED ORDER — LIDOCAINE HCL (PF) 1 % IJ SOLN
INTRAMUSCULAR | Status: DC | PRN
  Administered 2015-08-03: 6 mL via EPIDURAL
  Administered 2015-08-03: 4 mL

## 2015-08-03 MED ORDER — OXYCODONE-ACETAMINOPHEN 5-325 MG PO TABS
1.0000 | ORAL_TABLET | ORAL | Status: DC | PRN
  Administered 2015-08-04: 1 via ORAL
  Filled 2015-08-03: qty 1

## 2015-08-03 MED ORDER — OXYTOCIN 40 UNITS IN LACTATED RINGERS INFUSION - SIMPLE MED: 2.5000 [IU]/h | INTRAVENOUS | Status: DC

## 2015-08-03 MED ORDER — ONDANSETRON HCL 4 MG/2ML IJ SOLN
4.0000 mg | INTRAMUSCULAR | Status: DC | PRN
Start: 2015-08-03 — End: 2015-08-05

## 2015-08-03 MED ORDER — PENICILLIN G POTASSIUM 5000000 UNITS IJ SOLR
2.5000 10*6.[IU] | INTRAVENOUS | Status: DC
  Administered 2015-08-03: 2.5 10*6.[IU] via INTRAVENOUS
  Filled 2015-08-03 (×4): qty 2.5

## 2015-08-03 MED ORDER — OXYTOCIN BOLUS FROM INFUSION
500.0000 mL | INTRAVENOUS | Status: DC
  Administered 2015-08-03: 500 mL via INTRAVENOUS

## 2015-08-03 MED ORDER — PENICILLIN G POTASSIUM 5000000 UNITS IJ SOLR: 2.5000 10*6.[IU] | INTRAMUSCULAR | Status: DC

## 2015-08-03 MED ORDER — TETANUS-DIPHTH-ACELL PERTUSSIS 5-2.5-18.5 LF-MCG/0.5 IM SUSP: 0.5000 mL | Freq: Once | INTRAMUSCULAR | Status: DC

## 2015-08-03 MED ORDER — OXYCODONE-ACETAMINOPHEN 5-325 MG PO TABS
2.0000 | ORAL_TABLET | ORAL | Status: DC | PRN
  Administered 2015-08-05 (×2): 2 via ORAL
  Filled 2015-08-03 (×2): qty 2

## 2015-08-03 MED ORDER — ONDANSETRON HCL 4 MG PO TABS: 4.0000 mg | ORAL_TABLET | ORAL | Status: DC | PRN

## 2015-08-03 MED ORDER — EPHEDRINE 5 MG/ML INJ
10.0000 mg | INTRAVENOUS | Status: DC | PRN
  Filled 2015-08-03: qty 2

## 2015-08-03 MED ORDER — PHENYLEPHRINE 40 MCG/ML (10ML) SYRINGE FOR IV PUSH (FOR BLOOD PRESSURE SUPPORT)
80.0000 ug | PREFILLED_SYRINGE | INTRAVENOUS | Status: DC | PRN
  Administered 2015-08-03: 80 ug via INTRAVENOUS
  Filled 2015-08-03: qty 5

## 2015-08-03 MED ORDER — ACETAMINOPHEN 325 MG PO TABS: 650.0000 mg | ORAL_TABLET | ORAL | Status: DC | PRN

## 2015-08-03 MED ORDER — BENZOCAINE-MENTHOL 20-0.5 % EX AERO
1.0000 "application " | INHALATION_SPRAY | CUTANEOUS | Status: DC | PRN
  Administered 2015-08-04: 1 via TOPICAL
  Filled 2015-08-03: qty 56

## 2015-08-03 MED ORDER — PHENYLEPHRINE 40 MCG/ML (10ML) SYRINGE FOR IV PUSH (FOR BLOOD PRESSURE SUPPORT)
80.0000 ug | PREFILLED_SYRINGE | INTRAVENOUS | Status: DC | PRN
  Filled 2015-08-03: qty 10
  Filled 2015-08-03: qty 5

## 2015-08-03 MED ORDER — PRENATAL MULTIVITAMIN CH
1.0000 | ORAL_TABLET | Freq: Every day | ORAL | Status: DC
  Administered 2015-08-04: 1 via ORAL
  Filled 2015-08-03: qty 1

## 2015-08-03 MED ORDER — LACTATED RINGERS IV SOLN
INTRAVENOUS | Status: DC
  Administered 2015-08-03 (×2): via INTRAVENOUS

## 2015-08-03 MED ORDER — PENICILLIN G POTASSIUM 5000000 UNITS IJ SOLR: 5.0000 10*6.[IU] | Freq: Once | INTRAVENOUS | Status: DC

## 2015-08-03 NOTE — Anesthesia Preprocedure Evaluation (Signed)

## 2015-08-03 NOTE — H&P (Signed)
Sydney Waller is a 25 y.o. female presenting for IOL due to SGA and fetal anomalies, ? Holt Oram syndrome.  Maternal Medical History:  Contractions: Onset was less than 1 hour ago.    Fetal activity: Perceived fetal activity is normal.   Last perceived fetal movement was within the past hour.    Prenatal complications: IUGR.   Prenatal Complications - Diabetes: none.    OB History    Gravida Para Term Preterm AB TAB SAB Ectopic Multiple Living   2 1 1       1      Past Medical History  Diagnosis Date  . Anxiety   . Panic attack   . History of HPV infection   . Postpartum care following vaginal delivery (8/26) 09/11/2011  . Vaginal Pap smear, abnormal    Past Surgical History  Procedure Laterality Date  . Wisdom tooth extraction    . No past surgeries    . Colposcopy    . Tonsillectomy    . Cystoscopy w/ dilation of bladder     Family History: family history is not on file. Social History:  reports that she has never smoked. She has never used smokeless tobacco. She reports that she does not drink alcohol or use illicit drugs.   Prenatal Transfer Tool  Maternal Diabetes: No Genetic Screening: Abnormal:  Results: Other: Maternal Ultrasounds/Referrals: Abnormal:  Findings:   Other: Fetal Ultrasounds or other Referrals:  Fetal echo, Referred to Materal Fetal Medicine  Maternal Substance Abuse:  No Significant Maternal Medications:  None Significant Maternal Lab Results:  Lab values include: Group B Strep positive Other Comments:  None  Review of Systems  Constitutional: Negative.   All other systems reviewed and are negative.     Blood pressure 105/65, pulse 99, temperature 98.3 F (36.8 C), temperature source Oral, resp. rate 16, height 5\' 5"  (1.651 m), weight 71.668 kg (158 lb), unknown if currently breastfeeding. Maternal Exam:  Uterine Assessment: Contraction strength is mild.  Contraction frequency is rare.   Abdomen: Patient reports no abdominal tenderness.  Fetal presentation: vertex  Introitus: Normal vulva. Normal vagina.  Ferning test: not done.  Nitrazine test: not done. Amniotic fluid character: not assessed.  Pelvis: adequate for delivery.   Cervix: Cervix evaluated by digital exam.     Physical Exam  Nursing note and vitals reviewed. Constitutional: She is oriented to person, place, and time. She appears well-developed and well-nourished.  HENT:  Head: Normocephalic and atraumatic.  Eyes: Pupils are equal, round, and reactive to light.  Neck: Normal range of motion. Neck supple.  Cardiovascular: Normal rate and regular rhythm.   Respiratory: Effort normal and breath sounds normal.  GI: Soft. Bowel sounds are normal.  Genitourinary: Vagina normal and uterus normal. Guaiac negative stool.  Musculoskeletal: Normal range of motion.  Neurological: She is alert and oriented to person, place, and time. She has normal reflexes.  Skin: Skin is warm and dry.  Psychiatric: She has a normal mood and affect.    Prenatal labs: ABO, Rh:   Antibody:   Rubella:   RPR:    HBsAg:    HIV:    GBS: Positive (06/22 0000)   Assessment/Plan: 39 weeks iUP Fetal anomaly- ? Holt Oram, nl fetal echo, see MFM sono SGA vs IUGR IOL- PEds aware   Lachell Rochette J 08/03/2015, 7:46 AM

## 2015-08-03 NOTE — Anesthesia Pain Management Evaluation Note (Signed)
  CRNA Pain Management Visit Note  Patient: Sydney Waller, 25 y.o., female  "Hello I am a member of the anesthesia team at [facility]. We have an anesthesia team available at all times to provide care throughout the hospital, including epidural management and anesthesia for C-section. I don't know your plan for the delivery whether it a natural birth, water birth, IV sedation, nitrous supplementation, doula or epidural, but we want to meet your pain goals."   1.Was your pain managed to your expectations on prior hospitalizations?   Yes   2.What is your expectation for pain management during this hospitalization?     Epidural  3.How can we help you reach that goal? Epidural  Record the patient's initial score and the patient's pain goal.   Pain: 2  Pain Goal: 4 [facility] wants you to be able to say your pain was always managed very well.  Sydney Waller 08/03/2015

## 2015-08-03 NOTE — Consult Note (Signed)
[facility]  Delivery Note:  SVD   08/03/2015  4:48 PM  I was called to the delivery room at the request of the patient's obstetrician (Dr. Taavon) for known fetal anomalies in a term SVD.  PRENATAL HX:  This is a 24 y/o G2P1001 at 39 and 4/[redacted] weeks gestation who was admitted earlier today for IOL due to SGA and fetal anomalies.  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  There is reported mild ventriculomegaly.  A fetal echo was normal.  Mother is GBS positive and received two doses of penicillin G, so was adequately prophylaxed.  AROM at 1223 (~4 hours).    DELIVERY:  Infant was vigorous at delivery, requiring no resuscitation other than standard warming, drying and stimulation.  APGARs 8 and 9.  A pulse oximeter was applied and was in the mid 90s by 7 minutes of age.  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  There were no apparent spinal defects and the cardiovascular exam was normal.  After 10 minutes, baby left with nurse to assist parents with skin-to-skin care.   Given possible Holt-Oram syndrome, I would recommend that an echocardiogram be performed prior to discharge, as an ASD, which is most common with Holt-Oram, might not be evident on a fetal echo.  Would also recommend plain films of left arm and spine.  An absent radius can also, less commonly, be associated with thrombocytopenia (TAR) and anemia (Fanconi), so a CBC prior to discharge would also be warranted.  Finally, with the reported history of ventriculometry, would also recommend a heal ultrasound prior to discharge.   _____________________ Electronically Signed By: Damir Leung, MD Neonatologist  

## 2015-08-03 NOTE — Progress Notes (Signed)
Sydney Waller is a 25 y.o. G2P1001 at 9016w4d by LMP admitted for induction of labor due to fetal anomaly .  Subjective: comfortable  Objective: BP 109/55 mmHg  Pulse 76  Temp(Src) 97.1 F (36.2 C) (Axillary)  Resp 18  Ht 5\' 5"  (1.651 m)  Wt 71.668 kg (158 lb)  BMI 26.29 kg/m2  SpO2 100%      FHT:  FHR: 145 bpm, variability: FHR: 135 bpm, variability: moderate,  accelerations:  Present,  decelerations:  Absent,  accelerations:  Abscent,  decelerations:  Absent UC:   regular, every 3 minutes SVE:   Dilation: 6 Effacement (%): 80, 90 Station: 0 Exam by:: Sydney ShaveYancey Luft RN  Labs: Lab Results  Component Value Date   WBC 15.1* 08/03/2015   HGB 11.1* 08/03/2015   HCT 33.7* 08/03/2015   MCV 87.5 08/03/2015   PLT 304 08/03/2015    Assessment / Plan: Induction of labor due to fetal anomaly,  progressing well on pitocin  Labor: Progressing normally Preeclampsia:  no signs or symptoms of toxicity Fetal Wellbeing:  Category I Pain Control:  Epidural I/D:  n/a Anticipated MOD:  NSVD  NICU at delivery  Sydney Waller J 08/03/2015, 2:47 PM

## 2015-08-03 NOTE — Lactation Note (Signed)
This note was copied from a baby's chart. Lactation Consultation Note  Patient Name: Sydney Waller Today's Date: 08/03/2015 Reason for consult: Initial assessment Baby at 5 hr of life and dyad was sleeping. Left handouts with FOB and instructions for mom to call at next feeding.   Maternal Data    Feeding Feeding Type: Breast Fed Length of feed: 30 min  LATCH Score/Interventions Latch: Grasps breast easily, tongue down, lips flanged, rhythmical sucking. Intervention(s): Adjust position;Assist with latch  Audible Swallowing: A few with stimulation Intervention(s): Skin to skin  Type of Nipple: Everted at rest and after stimulation  Comfort (Breast/Nipple): Soft / non-tender     Hold (Positioning): No assistance needed to correctly position infant at breast. Intervention(s): Skin to skin  LATCH Score: 9  Lactation Tools Discussed/Used     Consult Status Consult Status: Follow-up Date: 08/03/15 Follow-up type: In-patient    Rulon Eisenmengerlizabeth E Joel Cowin 08/03/2015, 10:00 PM

## 2015-08-03 NOTE — Anesthesia Procedure Notes (Signed)

## 2015-08-04 LAB — CBC
HEMATOCRIT: 32 % — AB (ref 36.0–46.0)
HEMOGLOBIN: 10.5 g/dL — AB (ref 12.0–15.0)
MCH: 28.8 pg (ref 26.0–34.0)
MCHC: 32.8 g/dL (ref 30.0–36.0)
MCV: 87.9 fL (ref 78.0–100.0)
Platelets: 291 10*3/uL (ref 150–400)
RBC: 3.64 MIL/uL — ABNORMAL LOW (ref 3.87–5.11)
RDW: 14.5 % (ref 11.5–15.5)
WBC: 17.9 10*3/uL — ABNORMAL HIGH (ref 4.0–10.5)

## 2015-08-04 NOTE — Progress Notes (Signed)
PPD 1 SVD  S:  Reports feeling well, in good spirits.             Tolerating po/ No nausea or vomiting             Bleeding is moderate             Pain controlled with PO Motrin and one dose of Percocet early this AM for cramping.              Up ad lib / ambulatory / voiding freely.    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, Missing L arm radius    O:  A & O x 3 NAD             VS:  Filed Vitals:   08/03/15 1837 08/03/15 1931 08/03/15 2353 08/04/15 0555  BP: 102/49 101/60 103/64 108/71  Pulse: 58 64 63 76  Temp: 97.6 F (36.4 C) 98.1 F (36.7 C) 98 F (36.7 C) 98.3 F (36.8 C)  TempSrc: Oral Oral Oral Oral  Resp: 15 18 18 18   Height:      Weight:      SpO2: 100% 100% 100%     LABS:  Recent Labs  08/03/15 0742 08/04/15 0528  WBC 15.1* 17.9*  HGB 11.1* 10.5*  HCT 33.7* 32.0*  PLT 304 291    Blood type: --/--/A POS, A POS (07/18 0742)  Rubella:     I&O: I/O last 3 completed shifts: In: -  Out: 950 [Urine:800; Blood:150]        Abdomen: soft, non-tender, non-distended              Fundus: firm, non-tender, U -1  Perineum: no edema  Lochia: small  Extremities: no edema, no calf pain or tenderness    A/P: PPD # 1 25 y.o., [id number]    Principal Problem:   Postpartum care following vaginal delivery (7/18) Active Problems:   Fetal anomaly   Doing well - stable status  Routine post partum orders  Anticipate DC in AM  Neta Mendsaniela C Paul, CNM 08/04/2015, 8:44 AM

## 2015-08-04 NOTE — Anesthesia Postprocedure Evaluation (Signed)
Anesthesia Post Note  Patient: Sydney Waller  Procedure(s) Performed: * No procedures listed *  Patient location during evaluation: Mother Baby Anesthesia Type: Spinal Level of consciousness: awake and alert Pain management: pain level controlled Vital Signs Assessment: post-procedure vital signs reviewed and stable Respiratory status: spontaneous breathing Cardiovascular status: stable Postop Assessment: no headache, no backache, epidural receding and patient able to bend at knees Anesthetic complications: no     Last Vitals:  Filed Vitals:   08/03/15 2353 08/04/15 0555  BP: 103/64 108/71  Pulse: 63 76  Temp: 36.7 C 36.8 C  Resp: 18 18    Last Pain:  Filed Vitals:   08/04/15 1122  PainSc: 2    Pain Goal:                 [name]

## 2015-08-04 NOTE — Progress Notes (Signed)
MOB was referred for history of depression/anxiety. * Referral screened out by Clinical Social Worker because none of the following criteria appear to apply: ~ History of anxiety/depression during this pregnancy, or of post-partum depression. ~ Diagnosis of anxiety and/or depression within last 3 years OR * MOB's symptoms currently being treated with medication and/or therapy. Please contact the Clinical Social Worker if needs arise, or if MOB requests.   

## 2015-08-04 NOTE — Lactation Note (Signed)
This note was copied from a baby's chart. Lactation Consultation Note  Patient Name: Sydney Waller ZOXWR'UToday's Date: 08/04/2015 Reason for consult: Follow-up assessment Baby at 29 hr of life. Experienced bf Mom reports feedings are going well. She denies breast or nipple pain, voiced no concerns. She is having so trouble with getting her DEBP from insurance, offered other options. Discussed baby behavior, feeding frequency, voids, wt loss, breast changes, and nipple care. She can manually express and has spoon in room. She is aware of lactation services and support group. She will call as needed.    Maternal Data    Feeding Feeding Type: Breast Fed Length of feed: 45 min  LATCH Score/Interventions Latch: Grasps breast easily, tongue down, lips flanged, rhythmical sucking. Intervention(s): Adjust position  Audible Swallowing: A few with stimulation  Type of Nipple: Everted at rest and after stimulation  Comfort (Breast/Nipple): Soft / non-tender     Hold (Positioning): No assistance needed to correctly position infant at breast. Intervention(s): Support Pillows  LATCH Score: 9  Lactation Tools Discussed/Used     Consult Status Consult Status: PRN    Sydney Waller 08/04/2015, 9:48 PM

## 2015-08-05 NOTE — Discharge Summary (Signed)
OB Discharge Summary  Patient Name: Sydney Waller DOB: 11/06/1990 MRN: [id number]  Date of admission: 08/03/2015  Pt is a G2P2002 at [redacted]w[redacted]d. 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 yo [id number] at [redacted]w[redacted]d was admitted to the hospital 08/03/2015 for induction of labor.  Indication for induction: SGA and fetal anomalies.  Patient had an uncomplicated labor course as follows: Membrane Rupture Time/Date: 12:33 PM ,08/03/2015   Intrapartum Procedures: Episiotomy: None [1]                                         Lacerations:  Periurethral [8]  Patient had delivery of a Viable infant.  Information for the patient's newborn:  Floella, Ensz [[id number]]  Delivery Method: Vaginal, Spontaneous Delivery (Filed from Delivery Summary)   08/03/2015  Details of delivery can be found in separate delivery note.  Patient had a routine postpartum course. Patient is discharged home 08/05/2015.  Augmentation: Pitocin Delivering PROVIDER: Olivia Mackie                                                            Complications: None  Newborn Data: Live born female  Birth Weight: 7 lb 1.2 oz (3210 g) APGAR: 8, 9  Baby Feeding: Breast Disposition:home with mother  Post partum procedures:none    Labs: Lab Results  Component Value Date   WBC 17.9* 08/04/2015   HGB 10.5*  08/04/2015   HCT 32.0* 08/04/2015   MCV 87.9 08/04/2015   PLT 291 08/04/2015     Physical Exam @ time of discharge:  Filed Vitals:   08/03/15 2353 08/04/15 0555 08/04/15 1821 08/05/15 0610  BP: 103/64 108/71 103/52 108/65  Pulse: 63 76 63 65  Temp: 98 F (36.7 C) 98.3 F (36.8 C) 98.4 F (36.9 C) 98.3 F (36.8 C)  TempSrc: Oral Oral Oral Oral  Resp: Height:      Weight:      SpO2: 100%       General: alert, cooperative and no distress Lochia: appropriate Uterine Fundus: firm Perineum: intact Incision: N/A Extremities: No evidence of DVT seen on physical exam. No edema   CMP Latest Ref Rng 05/14/2008  Glucose 70 - 99 mg/dL 90  BUN 6 - 23 mg/dL 11  Creatinine 0.4 - 1.2 mg/dL 1.1  Sodium 841 - 324 mEq/L  138  Potassium 3.5 - 5.1 mEq/L 4.0  Chloride 96 - 112 mEq/L 107   Discharge Medications:    Medication List    ASK your doctor about these medications        Breast Pump Misc  1 Units by Does not apply route as needed.     prenatal multivitamin Tabs tablet  Take 1 tablet by mouth daily.      Urged OTC ibuprofen 600mg  po Q 6hrs prn and to continue stool softeners   Discharge instructions:  "Baby and Me Booklet" and [facility] Booklet  Diet: routine diet  Activity: Advance as tolerated. Pelvic rest x 6 weeks.   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 [facility] Planning referral and [place]     Yazid Pop K, MSN, [facility] 08/05/2015, 9:17 AM

## 2015-08-05 NOTE — Progress Notes (Signed)
Patient ID: Sydney Waller, female   DOB: 08-Nov-1990, 25 y.o.   MRN: [id number] PPD # 2  Subjective: Pt reports feeling well and eager for d/c home / Pain controlled with ibuprofen Tolerating po/ Voiding without problems/ No n/v Bleeding is light/ Newborn info:  Information for the patient's newborn:  Sydney Waller, Sydney Waller [[id number]][[id number]]  female  / circ performed by Dr Billy Coastaavon / Feeding: breast Infant with fetal anomalies, most c/w Orvan FalconerHolt Orem Syndrome.  Infant being d/c'ed today w/ f/u in am with peds. Brain MRI WNL, as were xrays. Planning consult and [place].    Objective:  VS: Blood pressure 108/65, pulse 65, temperature 98.3 F (36.8 C), temperature source Oral, resp. rate 18   Recent Labs  08/03/15 0742 08/04/15 0528  WBC 15.1* 17.9*  HGB 11.1* 10.5*  HCT 33.7* 32.0*  PLT 304 291    Blood type: A POS Rubella:   Immune, per review of chart by this provider   Physical Exam:  General:  alert, cooperative and no distress CV: Regular rate and rhythm Resp: clear Abdomen: soft, nontender, normal bowel sounds Uterine Fundus: firm, below umbilicus, nontender Perineum: healing well Lochia: minimal Ext: edema trace and Homans sign is negative, no sign of DVT    A/P: PPD # 2/ G2P2002/ S/P: SVD w/periurethral lac ABL Anemia; consume iron rich foods Doing well and stable for discharge home Ibuprofen 600mg  po Q 6 hrs prn pain #30, OTC WOB/GYN booklet given Routine pp visit in 6wks   Demetrius RevelFISHER,Geraldin Habermehl K, MSN, [facility] 08/05/2015, 8:35 AM

## 2015-08-05 NOTE — Progress Notes (Signed)
Assumed care of mom. Baby under nursery's care.

## 2015-08-05 NOTE — Lactation Note (Signed)
This note was copied from a baby's chart. Lactation Consultation Note  Patient Name: Sydney Waller ZOXWR'UToday's Date: 08/05/2015 Reason for consult: Follow-up assessment  Baby 42 hours old. Baby sleeping on mom's chest. Mom reports that baby is nursing well and she has been in touch with insurance company about getting her personal DEBP. Mom aware of OP/BFSG and LC phone line assistance after D/C.  Maternal Data    Feeding    LATCH Score/Interventions                      Lactation Tools Discussed/Used     Consult Status Consult Status: PRN    Geralynn OchsWILLIARD, Bernarda Erck 08/05/2015, 11:08 AM

## 2017-10-10 IMAGING — CR DG CHEST 1V
1 series · 1 of 1 positions shown · non-contrast
Comparison: None.

CLINICAL DATA: Shortness of breath and chest tightness

EXAM:
CHEST 1 VIEW

[w chest pa]
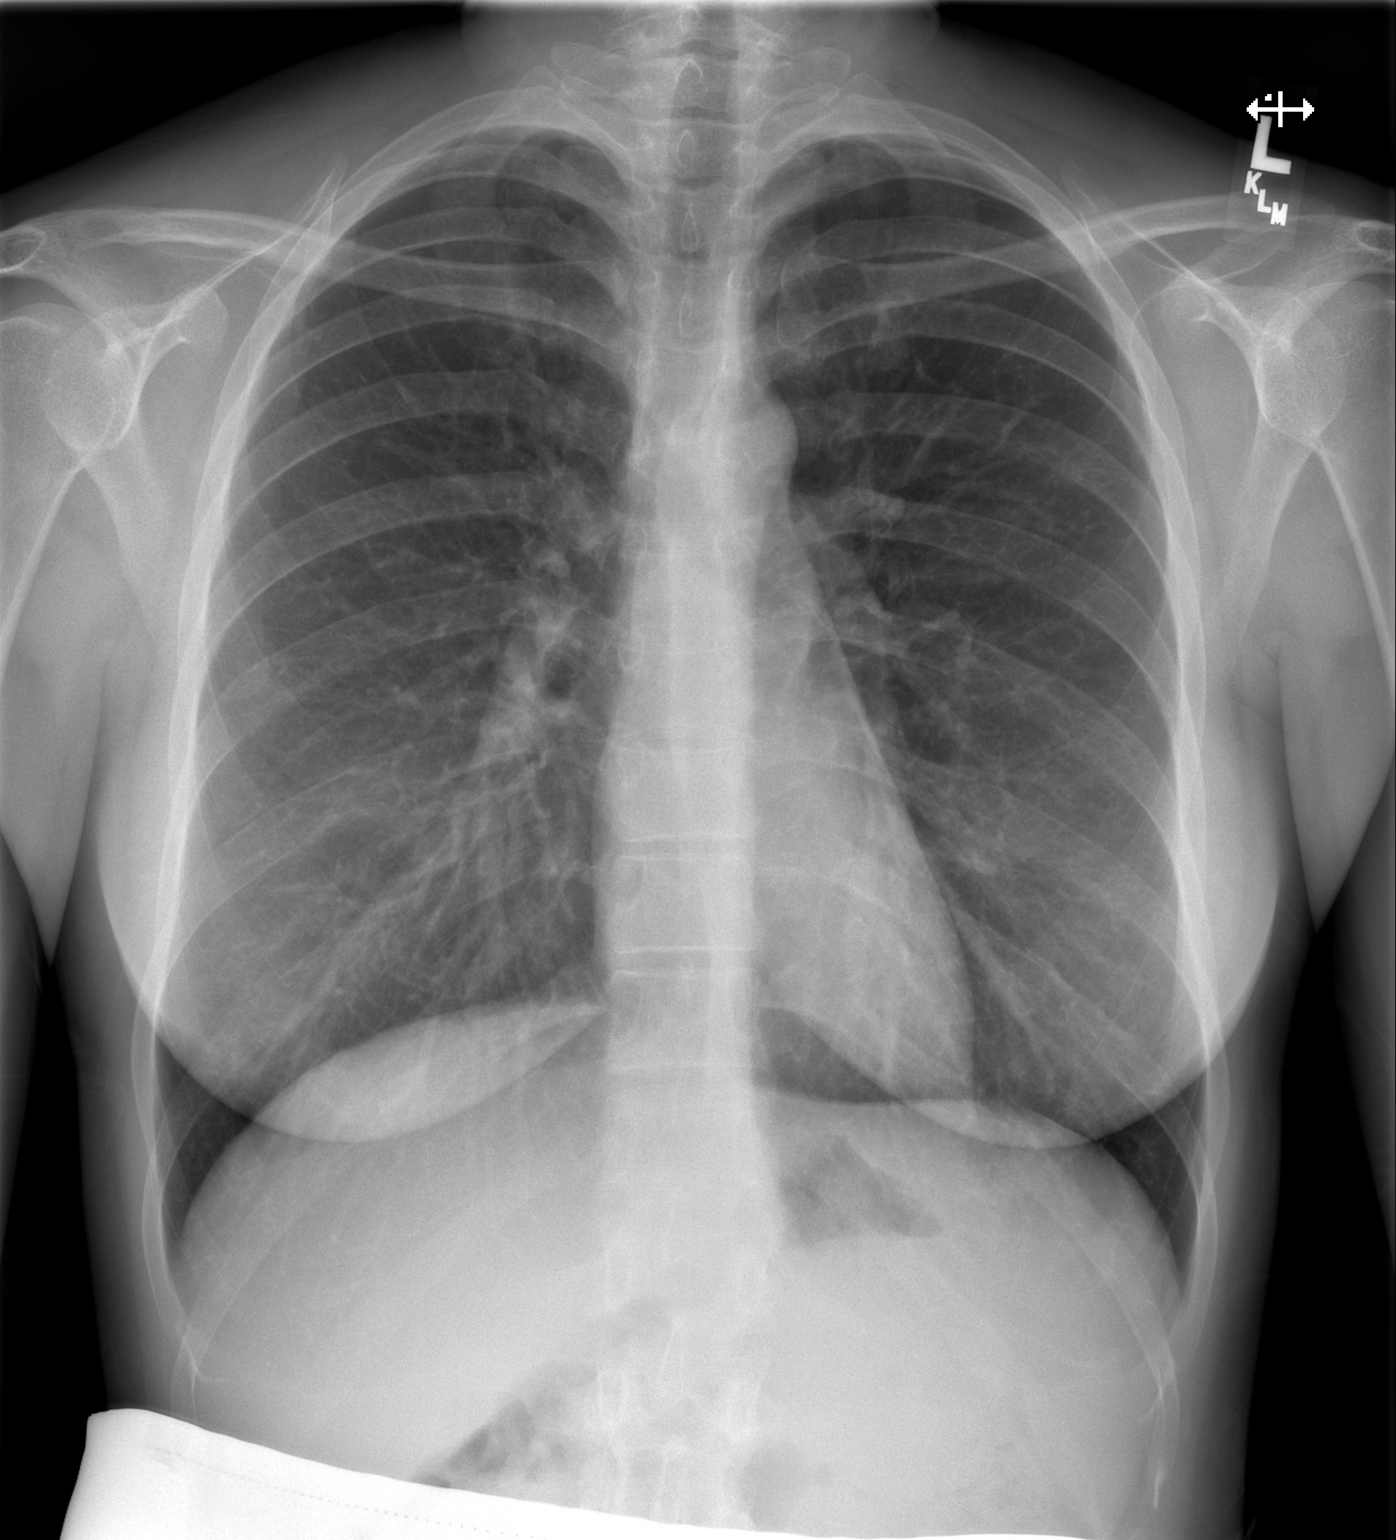

[1 of 1 positions shown; findings below may reference images not displayed]

FINDINGS: Lungs are clear. Heart size and pulmonary vascularity are normal. No
adenopathy. No bone lesions. No pneumothorax.
IMPRESSION: No abnormality noted.

## 2019-05-22 ENCOUNTER — Emergency Department (HOSPITAL_COMMUNITY)
Admission: EM | Admit: 2019-05-22 | Discharge: 2019-05-23 | Disposition: A | Discharge status: Home / Self Care | Payer: Managed Care, Other (non HMO) | Attending: Emergency Medicine | Admitting: Emergency Medicine

## 2019-05-22 ENCOUNTER — Other Ambulatory Visit: Payer: Self-pay

## 2019-05-22 ENCOUNTER — Encounter (HOSPITAL_COMMUNITY): Payer: Self-pay | Admitting: Emergency Medicine

## 2019-05-22 DIAGNOSIS — M549 Dorsalgia, unspecified: Secondary | ICD-10-CM | POA: Diagnosis present

## 2019-05-22 DIAGNOSIS — Z5321 Procedure and treatment not carried out due to patient leaving prior to being seen by health care provider: Secondary | ICD-10-CM | POA: Diagnosis not present

## 2019-05-22 LAB — CBC
HCT: 43.9 % (ref 36.0–46.0)
Hemoglobin: 14.3 g/dL (ref 12.0–15.0)
MCH: 31.4 pg (ref 26.0–34.0)
MCHC: 32.6 g/dL (ref 30.0–36.0)
MCV: 96.5 fL (ref 80.0–100.0)
Platelets: 318 10*3/uL (ref 150–400)
RBC: 4.55 MIL/uL (ref 3.87–5.11)
RDW: 13.1 % (ref 11.5–15.5)
WBC: 6.8 10*3/uL (ref 4.0–10.5)
nRBC: 0 % (ref 0.0–0.2)

## 2019-05-22 LAB — BASIC METABOLIC PANEL
Anion gap: 11 (ref 5–15)
BUN: 12 mg/dL (ref 6–20)
CO2: 27 mmol/L (ref 22–32)
Calcium: 9.4 mg/dL (ref 8.9–10.3)
Chloride: 102 mmol/L (ref 98–111)
Creatinine, Ser: 0.9 mg/dL (ref 0.44–1.00)
GFR calc Af Amer: >60 mL/min (ref >60)
GFR calc non Af Amer: >60 mL/min (ref >60)
Glucose, Bld: 76 mg/dL (ref 70–99)
Potassium: 3.6 mmol/L (ref 3.5–5.1)
Sodium: 140 mmol/L (ref 135–145)

## 2019-05-22 LAB — URINALYSIS, ROUTINE W REFLEX MICROSCOPIC
Bacteria, UA: NONE SEEN
Bilirubin Urine: NEGATIVE
Glucose, UA: NEGATIVE mg/dL
Ketones, ur: NEGATIVE mg/dL
Leukocytes,Ua: NEGATIVE
Nitrite: NEGATIVE
Protein, ur: NEGATIVE mg/dL
Specific Gravity, Urine: 1.003 — ABNORMAL LOW (ref 1.005–1.030)
pH: 6 (ref 5.0–8.0)

## 2019-05-22 LAB — I-STAT BETA HCG BLOOD, ED (MC, WL, AP ONLY): I-stat hCG, quantitative: <5 m[IU]/mL (ref <5)

## 2019-05-22 NOTE — ED Triage Notes (Signed)
Pt c/o right sided back pain that worsened today. States she was diagnosed with a UTI while on her honeymoon, has tried cipro and macrobid without relief of symptoms. Saw her OBGYN to have her IUD removed and they couldn't find it. Scheduled for an [place] in the morning.

## 2019-05-23 ENCOUNTER — Other Ambulatory Visit (HOSPITAL_COMMUNITY): Payer: Self-pay | Admitting: Obstetrics and Gynecology

## 2019-05-23 ENCOUNTER — Other Ambulatory Visit: Payer: Self-pay | Admitting: Obstetrics and Gynecology

## 2019-05-23 ENCOUNTER — Ambulatory Visit (HOSPITAL_COMMUNITY)
Admission: RE | Admit: 2019-05-23 | Discharge: 2019-05-23 | Disposition: A | Discharge status: Home / Self Care | Payer: Managed Care, Other (non HMO) | Source: Ambulatory Visit | Attending: Obstetrics and Gynecology | Admitting: Obstetrics and Gynecology

## 2019-05-23 DIAGNOSIS — R1031 Right lower quadrant pain: Secondary | ICD-10-CM

## 2019-05-23 NOTE — ED Notes (Signed)
Pt states that she has an appointment with her OBGYN in the morning and does not want to wait.

## 2019-08-26 LAB — OB RESULTS CONSOLE HIV ANTIBODY (ROUTINE TESTING): HIV: NONREACTIVE

## 2019-08-26 LAB — OB RESULTS CONSOLE RPR: RPR: NONREACTIVE

## 2019-08-26 LAB — OB RESULTS CONSOLE HEPATITIS B SURFACE ANTIGEN: Hepatitis B Surface Ag: NEGATIVE

## 2019-08-26 LAB — OB RESULTS CONSOLE ABO/RH: RH Type: POSITIVE

## 2019-08-26 LAB — OB RESULTS CONSOLE RUBELLA ANTIBODY, IGM: Rubella: IMMUNE

## 2019-08-26 LAB — OB RESULTS CONSOLE GC/CHLAMYDIA
Chlamydia: NEGATIVE
Gonorrhea: NEGATIVE

## 2019-08-26 LAB — OB RESULTS CONSOLE ANTIBODY SCREEN: Antibody Screen: NEGATIVE

## 2020-01-17 NOTE — L&D Delivery Note (Signed)
Delivery Note At 5:31 PM a viable and healthy female was delivered via Vaginal, Spontaneous (Presentation: Right Occiput Anterior).  APGAR: 9, 9; weight pending .   Placenta status:  spontaneous,  intact.  Cord: 3 vessels with the following complications: None.  Cord pH: na  Anesthesia: Epidural Episiotomy: None Lacerations: None Suture Repair: na Est. Blood Loss (mL): 250  Mom to postpartum.  Baby to Couplet care / Skin to Skin.  Jynesis Nakamura J 04/27/2020, 5:41 PM

## 2020-04-16 ENCOUNTER — Encounter (HOSPITAL_COMMUNITY): Payer: Self-pay | Admitting: *Deleted

## 2020-04-16 ENCOUNTER — Telehealth (HOSPITAL_COMMUNITY): Payer: Self-pay | Admitting: *Deleted

## 2020-04-16 NOTE — Telephone Encounter (Signed)
Preadmission screen  

## 2020-04-22 ENCOUNTER — Other Ambulatory Visit: Payer: Self-pay | Admitting: Obstetrics and Gynecology

## 2020-04-26 ENCOUNTER — Other Ambulatory Visit (HOSPITAL_COMMUNITY)
Admission: RE | Admit: 2020-04-26 | Discharge: 2020-04-26 | Disposition: A | Discharge status: Home / Self Care | Payer: Managed Care, Other (non HMO) | Source: Ambulatory Visit | Attending: Obstetrics and Gynecology | Admitting: Obstetrics and Gynecology

## 2020-04-26 DIAGNOSIS — Z01812 Encounter for preprocedural laboratory examination: Secondary | ICD-10-CM | POA: Insufficient documentation

## 2020-04-26 DIAGNOSIS — Z20822 Contact with and (suspected) exposure to covid-19: Secondary | ICD-10-CM | POA: Insufficient documentation

## 2020-04-26 LAB — SARS CORONAVIRUS 2 (TAT 6-24 HRS): SARS Coronavirus 2: NEGATIVE

## 2020-04-27 ENCOUNTER — Encounter (HOSPITAL_COMMUNITY): Payer: Self-pay | Admitting: Obstetrics and Gynecology

## 2020-04-27 ENCOUNTER — Inpatient Hospital Stay (HOSPITAL_COMMUNITY): Payer: Managed Care, Other (non HMO) | Admitting: Anesthesiology

## 2020-04-27 ENCOUNTER — Inpatient Hospital Stay (HOSPITAL_COMMUNITY)
Admission: AD | Admit: 2020-04-27 | Discharge: 2020-04-28 | DRG: 806 | Disposition: A | Discharge status: Home / Self Care | Payer: Managed Care, Other (non HMO) | Attending: Obstetrics and Gynecology | Admitting: Obstetrics and Gynecology

## 2020-04-27 ENCOUNTER — Inpatient Hospital Stay (HOSPITAL_COMMUNITY): Payer: Managed Care, Other (non HMO)

## 2020-04-27 DIAGNOSIS — O365930 Maternal care for other known or suspected poor fetal growth, third trimester, not applicable or unspecified: Secondary | ICD-10-CM | POA: Diagnosis present

## 2020-04-27 DIAGNOSIS — Z3A39 39 weeks gestation of pregnancy: Secondary | ICD-10-CM | POA: Diagnosis not present

## 2020-04-27 DIAGNOSIS — O9081 Anemia of the puerperium: Secondary | ICD-10-CM | POA: Diagnosis not present

## 2020-04-27 DIAGNOSIS — O9902 Anemia complicating childbirth: Secondary | ICD-10-CM | POA: Diagnosis present

## 2020-04-27 DIAGNOSIS — Z3490 Encounter for supervision of normal pregnancy, unspecified, unspecified trimester: Secondary | ICD-10-CM | POA: Diagnosis present

## 2020-04-27 DIAGNOSIS — Z20822 Contact with and (suspected) exposure to covid-19: Secondary | ICD-10-CM | POA: Diagnosis present

## 2020-04-27 DIAGNOSIS — D62 Acute posthemorrhagic anemia: Secondary | ICD-10-CM | POA: Diagnosis not present

## 2020-04-27 LAB — CBC
HCT: 34 % — ABNORMAL LOW (ref 36.0–46.0)
Hemoglobin: 10.9 g/dL — ABNORMAL LOW (ref 12.0–15.0)
MCH: 28 pg (ref 26.0–34.0)
MCHC: 32.1 g/dL (ref 30.0–36.0)
MCV: 87.4 fL (ref 80.0–100.0)
Platelets: 334 10*3/uL (ref 150–400)
RBC: 3.89 MIL/uL (ref 3.87–5.11)
RDW: 14.5 % (ref 11.5–15.5)
WBC: 14.7 10*3/uL — ABNORMAL HIGH (ref 4.0–10.5)
nRBC: 0 % (ref 0.0–0.2)

## 2020-04-27 LAB — TYPE AND SCREEN
ABO/RH(D): A POS
Antibody Screen: NEGATIVE

## 2020-04-27 MED ORDER — EPHEDRINE 5 MG/ML INJ: 10.0000 mg | INTRAVENOUS | Status: DC | PRN

## 2020-04-27 MED ORDER — WITCH HAZEL-GLYCERIN EX PADS: 1.0000 "application " | MEDICATED_PAD | CUTANEOUS | Status: DC | PRN

## 2020-04-27 MED ORDER — METHYLERGONOVINE MALEATE 0.2 MG PO TABS: 0.2000 mg | ORAL_TABLET | ORAL | Status: DC | PRN

## 2020-04-27 MED ORDER — ONDANSETRON HCL 4 MG PO TABS: 4.0000 mg | ORAL_TABLET | ORAL | Status: DC | PRN

## 2020-04-27 MED ORDER — LACTATED RINGERS IV SOLN: 500.0000 mL | Freq: Once | INTRAVENOUS | Status: DC

## 2020-04-27 MED ORDER — DIBUCAINE (PERIANAL) 1 % EX OINT: 1.0000 "application " | TOPICAL_OINTMENT | CUTANEOUS | Status: DC | PRN

## 2020-04-27 MED ORDER — OXYTOCIN-SODIUM CHLORIDE 30-0.9 UT/500ML-% IV SOLN
1.0000 m[IU]/min | INTRAVENOUS | Status: DC
  Administered 2020-04-27: 2 m[IU]/min via INTRAVENOUS

## 2020-04-27 MED ORDER — LIDOCAINE HCL (PF) 1 % IJ SOLN
INTRAMUSCULAR | Status: DC | PRN
  Administered 2020-04-27: 11 mL via EPIDURAL

## 2020-04-27 MED ORDER — ZOLPIDEM TARTRATE 5 MG PO TABS: 5.0000 mg | ORAL_TABLET | Freq: Every evening | ORAL | Status: DC | PRN

## 2020-04-27 MED ORDER — ONDANSETRON HCL 4 MG/2ML IJ SOLN: 4.0000 mg | Freq: Four times a day (QID) | INTRAMUSCULAR | Status: DC | PRN

## 2020-04-27 MED ORDER — SOD CITRATE-CITRIC ACID 500-334 MG/5ML PO SOLN
30.0000 mL | ORAL | Status: DC | PRN
  Administered 2020-04-27: 30 mL via ORAL
  Filled 2020-04-27: qty 15

## 2020-04-27 MED ORDER — DIPHENHYDRAMINE HCL 50 MG/ML IJ SOLN
12.5000 mg | INTRAMUSCULAR | Status: DC | PRN
Start: 2020-04-27 — End: 2020-04-27

## 2020-04-27 MED ORDER — COCONUT OIL OIL: 1.0000 "application " | TOPICAL_OIL | Status: DC | PRN

## 2020-04-27 MED ORDER — PHENYLEPHRINE 40 MCG/ML (10ML) SYRINGE FOR IV PUSH (FOR BLOOD PRESSURE SUPPORT)
80.0000 ug | PREFILLED_SYRINGE | INTRAVENOUS | Status: DC | PRN
  Filled 2020-04-27: qty 10

## 2020-04-27 MED ORDER — TERBUTALINE SULFATE 1 MG/ML IJ SOLN: 0.2500 mg | Freq: Once | INTRAMUSCULAR | Status: DC | PRN

## 2020-04-27 MED ORDER — LACTATED RINGERS IV SOLN: 500.0000 mL | INTRAVENOUS | Status: DC | PRN

## 2020-04-27 MED ORDER — SENNOSIDES-DOCUSATE SODIUM 8.6-50 MG PO TABS
2.0000 | ORAL_TABLET | Freq: Every day | ORAL | Status: DC
  Administered 2020-04-28: 2 via ORAL
  Filled 2020-04-27: qty 2

## 2020-04-27 MED ORDER — OXYCODONE-ACETAMINOPHEN 5-325 MG PO TABS
1.0000 | ORAL_TABLET | ORAL | Status: DC | PRN
Start: 2020-04-27 — End: 2020-04-29

## 2020-04-27 MED ORDER — FENTANYL-BUPIVACAINE-NACL 0.5-0.125-0.9 MG/250ML-% EP SOLN
12.0000 mL/h | EPIDURAL | Status: DC | PRN
  Administered 2020-04-27: 12 mL/h via EPIDURAL
  Filled 2020-04-27: qty 250

## 2020-04-27 MED ORDER — PHENYLEPHRINE 40 MCG/ML (10ML) SYRINGE FOR IV PUSH (FOR BLOOD PRESSURE SUPPORT): 80.0000 ug | PREFILLED_SYRINGE | INTRAVENOUS | Status: DC | PRN

## 2020-04-27 MED ORDER — IBUPROFEN 600 MG PO TABS
600.0000 mg | ORAL_TABLET | Freq: Four times a day (QID) | ORAL | Status: DC
  Administered 2020-04-27 – 2020-04-28 (×3): 600 mg via ORAL
  Filled 2020-04-27 (×3): qty 1

## 2020-04-27 MED ORDER — LACTATED RINGERS IV SOLN: INTRAVENOUS | Status: DC

## 2020-04-27 MED ORDER — ONDANSETRON HCL 4 MG/2ML IJ SOLN: 4.0000 mg | INTRAMUSCULAR | Status: DC | PRN

## 2020-04-27 MED ORDER — SIMETHICONE 80 MG PO CHEW: 80.0000 mg | CHEWABLE_TABLET | ORAL | Status: DC | PRN

## 2020-04-27 MED ORDER — OXYCODONE-ACETAMINOPHEN 5-325 MG PO TABS: 2.0000 | ORAL_TABLET | ORAL | Status: DC | PRN

## 2020-04-27 MED ORDER — LIDOCAINE HCL (PF) 1 % IJ SOLN: 30.0000 mL | INTRAMUSCULAR | Status: DC | PRN

## 2020-04-27 MED ORDER — ACETAMINOPHEN 325 MG PO TABS: 650.0000 mg | ORAL_TABLET | ORAL | Status: DC | PRN

## 2020-04-27 MED ORDER — OXYTOCIN BOLUS FROM INFUSION: 333.0000 mL | Freq: Once | INTRAVENOUS | Status: DC

## 2020-04-27 MED ORDER — OXYTOCIN-SODIUM CHLORIDE 30-0.9 UT/500ML-% IV SOLN
2.5000 [IU]/h | INTRAVENOUS | Status: DC
  Filled 2020-04-27: qty 500

## 2020-04-27 MED ORDER — METHYLERGONOVINE MALEATE 0.2 MG/ML IJ SOLN: 0.2000 mg | INTRAMUSCULAR | Status: DC | PRN

## 2020-04-27 MED ORDER — TETANUS-DIPHTH-ACELL PERTUSSIS 5-2.5-18.5 LF-MCG/0.5 IM SUSY: 0.5000 mL | PREFILLED_SYRINGE | Freq: Once | INTRAMUSCULAR | Status: DC

## 2020-04-27 MED ORDER — DIPHENHYDRAMINE HCL 25 MG PO CAPS: 25.0000 mg | ORAL_CAPSULE | Freq: Four times a day (QID) | ORAL | Status: DC | PRN

## 2020-04-27 MED ORDER — PRENATAL MULTIVITAMIN CH
1.0000 | ORAL_TABLET | Freq: Every day | ORAL | Status: DC
  Administered 2020-04-28: 1 via ORAL
  Filled 2020-04-27: qty 1

## 2020-04-27 MED ORDER — BENZOCAINE-MENTHOL 20-0.5 % EX AERO: 1.0000 "application " | INHALATION_SPRAY | CUTANEOUS | Status: DC | PRN

## 2020-04-27 MED ORDER — ACETAMINOPHEN 325 MG PO TABS
650.0000 mg | ORAL_TABLET | ORAL | Status: DC | PRN
  Administered 2020-04-28 (×3): 650 mg via ORAL
  Filled 2020-04-27 (×3): qty 2

## 2020-04-27 NOTE — Anesthesia Procedure Notes (Signed)
Epidural Patient location during procedure: OB Start time: 04/27/2020 2:45 PM End time: 04/27/2020 2:54 PM  Staffing Anesthesiologist: Lowella Curb, MD Performed: anesthesiologist   Preanesthetic Checklist Completed: patient identified, IV checked, site marked, risks and benefits discussed, surgical consent, monitors and equipment checked, pre-op evaluation and timeout performed  Epidural Patient position: sitting Prep: ChloraPrep Patient monitoring: heart rate, cardiac monitor, continuous pulse ox and blood pressure Approach: midline Location: L2-L3 Injection technique: LOR saline  Needle:  Needle type: Tuohy  Needle gauge: 17 G Needle length: 9 cm Needle insertion depth: 5 cm Catheter type: closed end flexible Catheter size: 20 Guage Catheter at skin depth: 9 cm Test dose: negative  Assessment Events: blood not aspirated, injection not painful, no injection resistance, no paresthesia and negative IV test  Additional Notes Epidural placed by SRNA under direct supervisionReason for block:procedure for pain

## 2020-04-27 NOTE — Lactation Note (Signed)
This note was copied from a baby's chart. Lactation Consultation Note Baby 5 hrs old. Mom holding baby STS. Mom stated the baby probably needs to feed. Mom's 3rd baby. Mom BF for 3 months d/t going back to work.  Mom has good everted nipples, mom done lip flange. Baby had good body alignment. Suggested occasionally massaging breast during BF. Newborn feeding habits, STS, I&O, supply and demand reviewed.  Mom is doing a great job. Mom will call PRN if needs LC. Lactation brochure given.  Patient Name: Sydney Waller Today's Date: 04/27/2020 Reason for consult: Initial assessment;Early term 37-38.6wks Age:84 hours  Maternal Data Has patient been taught Hand Expression?: Yes Does the patient have breastfeeding experience prior to this delivery?: Yes How long did the patient breastfeed?: 3 months  Feeding    LATCH Score Latch: Grasps breast easily, tongue down, lips flanged, rhythmical sucking.  Audible Swallowing: Spontaneous and intermittent  Type of Nipple: Everted at rest and after stimulation  Comfort (Breast/Nipple): Soft / non-tender  Hold (Positioning): No assistance needed to correctly position infant at breast.  LATCH Score: 10   Lactation Tools Discussed/Used    Interventions Interventions: Breast feeding basics reviewed;Support pillows;Position options;Skin to skin;Breast massage;Breast compression  Discharge WIC Program: No  Consult Status Consult Status: PRN Date: 04/28/20 Follow-up type: In-patient    Charyl Dancer 04/27/2020, 10:57 PM

## 2020-04-27 NOTE — H&P (Signed)
Sydney Waller is a 30 y.o. female presenting for IOL for SGA vs IUGR. OB History    Gravida  4   Para  2   Term  2   Preterm      AB  1   Living  2     SAB  1   IAB      Ectopic      Multiple  0   Live Births  2          Past Medical History:  Diagnosis Date  . Anxiety   . History of HPV infection   . Panic attack   . Postpartum care following vaginal delivery (7/18) 08/03/2015  . Postpartum care following vaginal delivery (8/26) 09/11/2011  . Vaginal Pap smear, abnormal    Past Surgical History:  Procedure Laterality Date  . COLPOSCOPY    . CYSTOSCOPY W/ DILATION OF BLADDER    . NO PAST SURGERIES    . TONSILLECTOMY    . WISDOM TOOTH EXTRACTION     Family History: family history is not on file. Social History:  reports that she has never smoked. She has never used smokeless tobacco. She reports that she does not drink alcohol and does not use drugs.     Maternal Diabetes: No Genetic Screening: Normal Maternal Ultrasounds/Referrals: Normal Fetal Ultrasounds or other Referrals:  None Maternal Substance Abuse:  No Significant Maternal Medications:  None Significant Maternal Lab Results:  Group B Strep negative Other Comments:  None  Review of Systems  Constitutional: Negative.   All other systems reviewed and are negative.  Maternal Medical History:  Reason for admission: Contractions.   Contractions: Onset was less than 1 hour ago.   Perceived severity is mild.    Fetal activity: Perceived fetal activity is normal.    Prenatal complications: no prenatal complications Prenatal Complications - Diabetes: none.    Dilation: 4 Effacement (%): 80 Station: -1 Exam by:: Sydney Sweis, MD Blood pressure 122/77, pulse 84, temperature 98.5 F (36.9 C), temperature source Oral, resp. rate 16, height 5\' 5"  (1.651 m), weight 79.5 kg, unknown if currently breastfeeding. Maternal Exam:  Uterine Assessment: Contraction strength is mild.  Contraction frequency is  irregular.   Abdomen: Patient reports no abdominal tenderness. Fetal presentation: vertex  Introitus: Normal vulva. Normal vagina.  Ferning test: positive.  Nitrazine test: positive. Amniotic fluid character: clear.  Pelvis: adequate for delivery.   Cervix: Cervix evaluated by digital exam.     Physical Exam Constitutional:      Appearance: Normal appearance. She is normal weight.  HENT:     Head: Normocephalic and atraumatic.  Cardiovascular:     Rate and Rhythm: Normal rate and regular rhythm.     Pulses: Normal pulses.     Heart sounds: Normal heart sounds.  Pulmonary:     Effort: Pulmonary effort is normal.     Breath sounds: Normal breath sounds.  Abdominal:     General: Abdomen is flat.     Palpations: Abdomen is soft.  Genitourinary:    General: Normal vulva.  Musculoskeletal:        General: Normal range of motion.     Cervical back: Normal range of motion and neck supple.  Skin:    General: Skin is warm and dry.  Neurological:     General: No focal deficit present.     Mental Status: She is alert and oriented to person, place, and time.  Psychiatric:        Mood  and Affect: Mood normal.        Behavior: Behavior normal.     Prenatal labs: ABO, Rh: --/--/A POS (04/12 1306) Antibody: NEG (04/12 1306) Rubella: Immune (08/10 0000) RPR: Nonreactive (08/10 0000)  HBsAg: Negative (08/10 0000)  HIV: Non-reactive (08/10 0000)  GBS:   neg  Assessment/Plan: 39wk IUP SGA vs IUGR IOL   Sydney Waller J 04/27/2020, 2:06 PM

## 2020-04-27 NOTE — Anesthesia Preprocedure Evaluation (Signed)

## 2020-04-27 NOTE — Lactation Note (Signed)
This note was copied from a baby's chart. Lactation Consultation Note  Patient Name: Girl Galilee Pierron Today's Date: 04/27/2020 Reason for consult: Early term 37-38.6wks;L&D Initial assessment Age:31 hours    Mom holding infant STS and feeding in cradle hold when LC arrived.  Infant sucking with narrow gape with mouth and lips visible.   Mom is exp. With BF.  3 months with 61 and 16 year old.  Mom doesn't like pumping and feels when she went back to work, her milk supply decreased due to that fact.  LC reviewed feeding with cues, 8-12 BF in 24 hours, STS, and hand expression prior to feeding.  LC offered to assist with getting infant latched more deeply.  Infant opened wide but would narrow gape prior to latching.  Mom's nipple appeared pinched/crimped and white at the crease.  LC provided tips are latching deeper and offered to assist in latching in a different position.  Infant latched in football hold on the right side.  Mom states the comfort improved.    LC congratulated mom and dad on the birth of their daughter.  Lactation to follow up and mother/baby unit.   Maternal Data Has patient been taught Hand Expression?: Yes Does the patient have breastfeeding experience prior to this delivery?: Yes How long did the patient breastfeed?: 3 months with her 39 and 31 year old  Feeding Mother's Current Feeding Choice: Breast Milk  LATCH Score Latch: Grasps breast easily, tongue down, lips flanged, rhythmical sucking.  Audible Swallowing: None  Type of Nipple: Everted at rest and after stimulation  Comfort (Breast/Nipple): Soft / non-tender  Hold (Positioning): Assistance needed to correctly position infant at breast and maintain latch. (infant's lips visible during feedin, assisted with trying to latch infant deeper on the breast)  LATCH Score: 7   Lactation Tools Discussed/Used    Interventions    Discharge    Consult Status Consult Status: Follow-up Date:  04/27/20 Follow-up type: In-patient    Maryruth Hancock [facility] 04/27/2020, 6:35 PM

## 2020-04-28 DIAGNOSIS — O9902 Anemia complicating childbirth: Secondary | ICD-10-CM | POA: Diagnosis present

## 2020-04-28 LAB — CBC
HCT: 28 % — ABNORMAL LOW (ref 36.0–46.0)
Hemoglobin: 8.9 g/dL — ABNORMAL LOW (ref 12.0–15.0)
MCH: 28.4 pg (ref 26.0–34.0)
MCHC: 31.8 g/dL (ref 30.0–36.0)
MCV: 89.5 fL (ref 80.0–100.0)
Platelets: 295 10*3/uL (ref 150–400)
RBC: 3.13 MIL/uL — ABNORMAL LOW (ref 3.87–5.11)
RDW: 14.3 % (ref 11.5–15.5)
WBC: 16.8 10*3/uL — ABNORMAL HIGH (ref 4.0–10.5)
nRBC: 0 % (ref 0.0–0.2)

## 2020-04-28 LAB — RPR: RPR Ser Ql: NONREACTIVE

## 2020-04-28 MED ORDER — ACETAMINOPHEN 500 MG PO TABS: 1000.0000 mg | ORAL_TABLET | Freq: Four times a day (QID) | ORAL | 2 refills | Status: AC | PRN

## 2020-04-28 MED ORDER — POLYSACCHARIDE IRON COMPLEX 150 MG PO CAPS: 150.0000 mg | ORAL_CAPSULE | Freq: Every day | ORAL | Status: AC

## 2020-04-28 MED ORDER — BENZOCAINE-MENTHOL 20-0.5 % EX AERO: 1.0000 "application " | INHALATION_SPRAY | CUTANEOUS | Status: AC | PRN

## 2020-04-28 MED ORDER — MAGNESIUM OXIDE -MG SUPPLEMENT 400 (240 MG) MG PO TABS: 400.0000 mg | ORAL_TABLET | Freq: Every day | ORAL | Status: AC

## 2020-04-28 MED ORDER — COCONUT OIL OIL: 1.0000 "application " | TOPICAL_OIL | 0 refills | Status: AC | PRN

## 2020-04-28 MED ORDER — MAGNESIUM OXIDE 400 (241.3 MG) MG PO TABS
400.0000 mg | ORAL_TABLET | Freq: Every day | ORAL | Status: DC
  Administered 2020-04-28: 400 mg via ORAL
  Filled 2020-04-28: qty 1

## 2020-04-28 MED ORDER — IBUPROFEN 600 MG PO TABS: 600.0000 mg | ORAL_TABLET | Freq: Four times a day (QID) | ORAL | 0 refills | Status: AC

## 2020-04-28 MED ORDER — POLYSACCHARIDE IRON COMPLEX 150 MG PO CAPS
150.0000 mg | ORAL_CAPSULE | Freq: Every day | ORAL | Status: DC
  Administered 2020-04-28: 150 mg via ORAL
  Filled 2020-04-28: qty 1

## 2020-04-28 NOTE — Anesthesia Postprocedure Evaluation (Signed)
Anesthesia Post Note  Patient: Sydney Waller  Procedure(s) Performed: AN AD HOC LABOR EPIDURAL     Patient location during evaluation: Mother Baby Anesthesia Type: Epidural Level of consciousness: awake and alert and oriented Pain management: pain level controlled Vital Signs Assessment: post-procedure vital signs reviewed and stable Respiratory status: spontaneous breathing Cardiovascular status: blood pressure returned to baseline Postop Assessment: no headache, no backache and able to ambulate Anesthetic complications: no   No complications documented.  Last Vitals:  Vitals:   04/28/20 0100 04/28/20 0530  BP: 100/66 114/67  Pulse: 62 91  Resp: 18 17  Temp: 37 C 36.7 C  SpO2: 100% 100%    Last Pain:  Vitals:   04/28/20 0530  TempSrc: Oral  PainSc: 5    Pain Goal:                   Ruvim Risko A Yarelli Decelles

## 2020-04-28 NOTE — Discharge Summary (Signed)
OB Discharge Summary  Patient Name: Sydney Waller DOB: 1990/07/17 MRN: [id number]  Date of admission: 04/27/2020 Delivering provider: Olivia Waller   Admitting diagnosis: Encounter for induction of labor [Z34.90] Intrauterine pregnancy: [redacted]w[redacted]d     Secondary diagnosis: Patient Active Problem List   Diagnosis Date Noted  . Maternal anemia, with delivery - IDA w/ ABL 04/28/2020  . Encounter for induction of labor 04/27/2020  . SVD (spontaneous vaginal delivery) 04/27/2020  . Postpartum care following vaginal delivery 4/12 08/03/2015   Additional problems:none   Date of discharge: 04/28/2020   Discharge diagnosis: Principal Problem:   Postpartum care following vaginal delivery 4/12 Active Problems:   Encounter for induction of labor   SVD (spontaneous vaginal delivery)   Maternal anemia, with delivery - IDA w/ ABL                                                              Post partum procedures:none  Augmentation: AROM and Pitocin Pain control: Epidural  Laceration:None  Episiotomy:None  Complications: None  Hospital course:  Induction of Labor With Vaginal Delivery   30 y.o. yo [phone] at [redacted]w[redacted]d was admitted to the hospital 04/27/2020 for induction of labor.  Indication for induction: SGA vs IUGR.  Patient had an uncomplicated labor course as follows: Membrane Rupture Time/Date: 1:56 PM ,04/27/2020   Delivery Method:Vaginal, Spontaneous  Episiotomy: None  Lacerations:  None  Details of delivery can be found in separate delivery note.  Patient had a routine postpartum course. Patient is discharged home 04/28/20.  Newborn Data: Birth date:04/27/2020  Birth time:5:31 PM  Gender:Female  Living status:Living  Apgars:9 ,9  Weight:3430 g   Physical exam  Vitals:   04/27/20 2100 04/28/20 0100 04/28/20 0530 04/28/20 0947  BP: 113/72 100/66 114/67 106/64  Pulse: 89 62 91 67  Resp: 17 18 17 17   Temp: 98 F (36.7 C) 98.6 F (37 C) 98.1 F (36.7 C) 98.4 F (36.9 C)   TempSrc: Oral Oral Oral Oral  SpO2: 100% 100% 100%   Weight:      Height:       General: alert, cooperative and no distress Lochia: appropriate Uterine Fundus: firm Incision: N/A Perineum: intact, no edema DVT Evaluation: No cords or calf tenderness. No significant calf/ankle edema. Labs: Lab Results  Component Value Date   WBC 16.8 (H) 04/28/2020   HGB 8.9 (L) 04/28/2020   HCT 28.0 (L) 04/28/2020   MCV 89.5 04/28/2020   PLT 295 04/28/2020   CMP Latest Ref Rng & Units 05/22/2019  Glucose 70 - 99 mg/dL 76  BUN 6 - 20 mg/dL 12  Creatinine 07/22/2019 - 0.93 mg/dL 2.35  Sodium 5.73 - 220 mmol/L 140  Potassium 3.5 - 5.1 mmol/L 3.6  Chloride 98 - 111 mmol/L 102  CO2 22 - 32 mmol/L 27  Calcium 8.9 - 10.3 mg/dL 9.4     Discharge instruction:  per After Visit Summary,  [facility] OB booklet and  "Understanding Mother & Baby Care" hospital booklet  After Visit Meds:  Allergies as of 04/28/2020   No Known Allergies     Medication List    TAKE these medications   acetaminophen 500 MG tablet Commonly known as: TYLENOL Take 2 tablets (1,000 mg total) by mouth every 6 (six) hours as needed.  benzocaine-Menthol 20-0.5 % Aero Commonly known as: DERMOPLAST Apply 1 application topically as needed for irritation (perineal discomfort).   Breast Pump Misc 1 Units by Does not apply route as needed.   coconut oil Oil Apply 1 application topically as needed.   ibuprofen 600 MG tablet Commonly known as: ADVIL Take 1 tablet (600 mg total) by mouth every 6 (six) hours.   iron polysaccharides 150 MG capsule Commonly known as: Ferrex 150 Take 1 capsule (150 mg total) by mouth daily.   Magnesium Oxide 400 (240 Mg) MG Tabs Take 1 tablet (400 mg total) by mouth daily. For prevention of constipation.   prenatal multivitamin Tabs tablet Take 1 tablet by mouth daily.            Discharge Care Instructions  (From admission, onward)         Start     Ordered   04/28/20 0000   Discharge wound care:       Comments: Sitz baths 2 times /day with warm water x 1 week. May add herbals: 1 ounce dried comfrey leaf* 1 ounce calendula flowers 1 ounce lavender flowers  Supplies can be found online at [employer] sources at [employer], [employer]  1/2 ounce dried uva ursi leaves 1/2 ounce witch hazel blossoms (if you can find them) 1/2 ounce dried sage leaf 1/2 cup sea salt Directions: Bring 2 quarts of water to a boil. Turn off heat, and place 1 ounce (approximately 1 large handful) of the above mixed herbs (not the salt) into the pot. Steep, covered, for 30 minutes.  Strain the liquid well with a fine mesh strainer, and discard the herb material. Add 2 quarts of liquid to the tub, along with the 1/2 cup of salt. This medicinal liquid can also be made into compresses and peri-rinses.   04/28/20 1044          Diet: iron rich diet  Activity: Advance as tolerated. Pelvic rest for 6 weeks.   Postpartum contraception: Not Discussed  Newborn Data: Live born female  Birth Weight: 7 lb 9 oz (3430 g) APGAR: 9, 9  Newborn Delivery   Birth date/time: 04/27/2020 17:31:00 Delivery type: Vaginal, Spontaneous      named [name] Baby Feeding: Breast Disposition:home with mother   Delivery Report:  Review the Delivery Report for details.    Follow up:  Follow-up Information    Sydney Mackie, MD. Schedule an appointment as soon as possible for a visit in 6 week(s).   Specialty: Obstetrics and Gynecology Contact information: [address] [place] [address] [phone]                 Signed: Cipriano Mile, MSN 04/28/2020, 10:45 AM

## 2020-04-28 NOTE — Social Work (Signed)
MOB was referred for history of anxiety.   * Referral screened out by Clinical Social Worker because none of the following criteria appear to apply:  ~ History of anxiety/depression during this pregnancy, or of post-partum depression following prior delivery. ~ Diagnosis of anxiety and/or depression within last 3 years. CSW reviewed chart and notes a diagnosis date of 2011.  OR * MOB's symptoms currently being treated with medication and/or therapy.  Please contact the Clinical Social Worker if needs arise, by MOB request, or if MOB scores greater than 9/yes to question 10 on Edinburgh Postpartum Depression Screen.  Natalie Leclaire, LCSWA Clinical Social Work [facility]  [phone]  

## 2020-04-28 NOTE — Discharge Instructions (Signed)
Lactation outpatient support - home visit  Linda Coppola RN, MHA, IBCLC at [facility]: Lactation Consultant  [email or web address] Mail: [email or web address] Tel: [phone]    Additional resources:  [facility] [email or web address]   Chiropractic specialist   Dr. Leanna Hastings [email or web address]  Craniosacral therapy for baby  Erin Balkind  [email or web address]  

## 2020-04-28 NOTE — Lactation Note (Addendum)
This note was copied from a baby's chart. Lactation Consultation Note  Patient Name: Sydney Waller Today's Date: 04/28/2020   Age:32 hours   LC talked with RN, Henderson Newcomer before shift change to see how feeding and pumping was going. RN states feeding going well and Mom is pumping.   Mom will request LC assistance as needed and is listed as PRN at this time. Infant had 5 urine and 1 stool since birth.  Maternal Data    Feeding    LATCH Score Latch: Grasps breast easily, tongue down, lips flanged, rhythmical sucking.  Audible Swallowing: A few with stimulation  Type of Nipple: Everted at rest and after stimulation  Comfort (Breast/Nipple): Soft / non-tender  Hold (Positioning): No assistance needed to correctly position infant at breast.  LATCH Score: 9   Lactation Tools Discussed/Used    Interventions    Discharge    Consult Status      Dodi Leu  Nicholson-Springer 04/28/2020, 7:25 PM

## 2024-01-18 ENCOUNTER — Inpatient Hospital Stay: Admission: RE | Admit: 2024-01-18 | Payer: Self-pay | Source: Ambulatory Visit

## 2024-01-18 ENCOUNTER — Ambulatory Visit
Admission: EM | Admit: 2024-01-18 | Discharge: 2024-01-18 | Disposition: A | Discharge status: Home / Self Care | Attending: Family Medicine | Admitting: Family Medicine

## 2024-01-18 ENCOUNTER — Encounter: Payer: Self-pay | Admitting: Emergency Medicine

## 2024-01-18 DIAGNOSIS — J069 Acute upper respiratory infection, unspecified: Secondary | ICD-10-CM

## 2024-01-18 DIAGNOSIS — J0110 Acute frontal sinusitis, unspecified: Secondary | ICD-10-CM | POA: Diagnosis not present

## 2024-01-18 MED ORDER — AMOXICILLIN-POT CLAVULANATE 875-125 MG PO TABS: 1.0000 | ORAL_TABLET | Freq: Two times a day (BID) | ORAL | 0 refills | Status: AC

## 2024-01-18 NOTE — ED Provider Notes (Signed)
 " [facility]    CSN: [id number] Arrival date & time: 01/18/24  1130      History   Chief Complaint Chief Complaint  Patient presents with   Chest Congestion    HPI Sydney Waller is a 34 y.o. female.   Six days ago patient developed sinus congestion, chills, and mild cough.  She has remained extremely fatigued and has persistent frontal headache and sinus congestion.  She denies pleuritic pain and shortness of breath.  The history is provided by the patient.    Past Medical History:  Diagnosis Date   Anxiety    History of HPV infection    Panic attack    Postpartum care following vaginal delivery (7/18) 08/03/2015   Postpartum care following vaginal delivery (8/26) 09/11/2011   Vaginal Pap smear, abnormal     Patient Active Problem List   Diagnosis Date Noted   Maternal anemia, with delivery - IDA w/ ABL 04/28/2020   Encounter for induction of labor 04/27/2020   SVD (spontaneous vaginal delivery) 04/27/2020   Postpartum care following vaginal delivery 4/12 08/03/2015    Past Surgical History:  Procedure Laterality Date   COLPOSCOPY     CYSTOSCOPY W/ DILATION OF BLADDER     NO PAST SURGERIES     TONSILLECTOMY     WISDOM TOOTH EXTRACTION      OB History     Gravida  4   Para  3   Term  3   Preterm      AB  1   Living  3      SAB  1   IAB      Ectopic      Multiple  0   Live Births  3            Home Medications    Prior to Admission medications  Medication Sig Start Date End Date Taking? Authorizing Provider  amoxicillin -clavulanate (AUGMENTIN ) 875-125 MG tablet Take 1 tablet by mouth every 12 (twelve) hours. 01/18/24  Yes Pauline Garnette LABOR, MD  benzocaine -Menthol  (DERMOPLAST) 20-0.5 % AERO Apply 1 application topically as needed for irritation (perineal discomfort). 04/28/20   Paul, Daniela C, CNM  calcium carbonate (TUMS - DOSED IN MG ELEMENTAL CALCIUM) 500 MG chewable tablet Chew 1 tablet by mouth 4 (four) times daily  as needed for indigestion or heartburn.    [provider]  coconut oil OIL Apply 1 application topically as needed. 04/28/20   Paul, Daniela C, CNM  famotidine (PEPCID) 20 MG tablet Take 20 mg by mouth 2 (two) times daily.    [provider]  ibuprofen  (ADVIL ) 600 MG tablet Take 1 tablet (600 mg total) by mouth every 6 (six) hours. 04/28/20   Paul, Daniela C, CNM  iron  polysaccharides (FERREX 150) 150 MG capsule Take 1 capsule (150 mg total) by mouth daily. 04/28/20   Paul, Daniela C, CNM  Magnesium  Oxide 400 (240 Mg) MG TABS Take 1 tablet (400 mg total) by mouth daily. For prevention of constipation. 04/28/20   Paul, Daniela C, CNM  Prenatal Vit-Fe Fumarate-FA (PRENATAL MULTIVITAMIN) TABS Take 1 tablet by mouth daily.    [provider]    Family History History reviewed. No pertinent family history.  Social History Social History[1]   Allergies   Patient has no known allergies.   Review of Systems Review of Systems No sore throat + cough No pleuritic pain No wheezing + nasal congestion + post-nasal drainage + sinus pain/pressure No  itchy/red eyes No earache No hemoptysis No SOB No fever, + chills No nausea No vomiting No abdominal pain No diarrhea No urinary symptoms No skin rash + fatigue No myalgias + headache Used OTC meds (Mucinex) without relief   Physical Exam Triage Vital Signs ED Triage Vitals  Encounter Vitals Group     BP 01/18/24 1204 138/87     Girls Systolic BP Percentile --      Girls Diastolic BP Percentile --      Boys Systolic BP Percentile --      Boys Diastolic BP Percentile --      Pulse Rate 01/18/24 1204 71     Resp 01/18/24 1204 18     Temp 01/18/24 1204 98.6 F (37 C)     Temp Source 01/18/24 1204 Oral     SpO2 01/18/24 1204 100 %     Weight 01/18/24 1203 150 lb (68 kg)     Height 01/18/24 1203 5' 5 (1.651 m)     Head Circumference --      Peak Flow --      Pain Score 01/18/24 1202 0     Pain Loc --       Pain Education --      Exclude from Growth Chart --    No data found.  Updated Vital Signs BP 138/87 (BP Location: Right Arm)   Pulse 71   Temp 98.6 F (37 C) (Oral)   Resp 18   Ht 5' 5 (1.651 m)   Wt 68 kg   LMP 12/28/2023   SpO2 100%   Breastfeeding No   BMI 24.96 kg/m   Visual Acuity Right Eye Distance:   Left Eye Distance:   Bilateral Distance:    Right Eye Near:   Left Eye Near:    Bilateral Near:     Physical Exam Nursing notes and Vital Signs reviewed. Appearance:  Patient appears stated age, and in no acute distress Eyes:  Pupils are equal, round, and reactive to light and accomodation.  Extraocular movement is intact.  Conjunctivae are not inflamed  Ears:  Canals normal.  Tympanic membranes normal.  Nose:  Mildly congested turbinates.  No sinus tenderness. Frontal sinus tenderness is present.  Pharynx:  Normal Neck:  Supple.  Mildly enlarged lateral nodes are present, tender to palpation on the left.   Lungs:  Clear to auscultation.  Breath sounds are equal.  Moving air well. Heart:  Regular rate and rhythm without murmurs, rubs, or gallops.  Abdomen:  Nontender without masses or hepatosplenomegaly.  Bowel sounds are present.  No CVA or flank tenderness.  Extremities:  No edema.  Skin:  No rash present.   UC Treatments / Results  Labs (all labs ordered are listed, but only abnormal results are displayed) Labs Reviewed - No data to display  EKG   Radiology No results found.  Procedures Procedures (including critical care time)  Medications Ordered in UC Medications - No data to display  Initial Impression / Assessment and Plan / UC Course  I have reviewed the triage vital signs and the nursing notes.  Pertinent labs & imaging results that were available during my care of the patient were reviewed by me and considered in my medical decision making (see chart for details).    Begin Augmentin  875 BID. Followup with Family Doctor if not  improved in one week.   Final Clinical Impressions(s) / UC Diagnoses   Final diagnoses:  Viral URI with cough  Acute  frontal sinusitis, recurrence not specified     Discharge Instructions      Take plain guaifenesin (1200mg  extended release tabs such as Mucinex) twice daily, with plenty of water, for cough and congestion.  May add Pseudoephedrine (30mg , one or two every 4 to 6 hours) for sinus congestion.  Get adequate rest.   May use Afrin nasal spray (or generic oxymetazoline) each morning for about 5 days and then discontinue.  Also recommend using saline nasal spray several times daily and saline nasal irrigation (AYR is a common brand).  Use Flonase nasal spray each morning after using Afrin nasal spray and saline nasal irrigation. Try warm salt water gargles for sore throat.  Stop all antihistamines for now, and other non-prescription cough/cold preparations. May take Delsym Cough Suppressant (12 Hour Cough Relief) at bedtime for nighttime cough.     ED Prescriptions     Medication Sig Dispense Auth. Provider   amoxicillin -clavulanate (AUGMENTIN ) 875-125 MG tablet Take 1 tablet by mouth every 12 (twelve) hours. 14 tablet Pauline Garnette LABOR, MD           [1]  Social History Tobacco Use   Smoking status: Never   Smokeless tobacco: Never  Vaping Use   Vaping status: Never Used  Substance Use Topics   Alcohol use: No   Drug use: No     Pauline Garnette LABOR, MD 01/20/24 1612  "

## 2024-01-18 NOTE — Discharge Instructions (Signed)
Take plain guaifenesin (1200mg extended release tabs such as Mucinex) twice daily, with plenty of water, for cough and congestion.  May add Pseudoephedrine (30mg, one or two every 4 to 6 hours) for sinus congestion.  Get adequate rest.   °May use Afrin nasal spray (or generic oxymetazoline) each morning for about 5 days and then discontinue.  Also recommend using saline nasal spray several times daily and saline nasal irrigation (AYR is a common brand).  Use Flonase nasal spray each morning after using Afrin nasal spray and saline nasal irrigation. °Try warm salt water gargles for sore throat.  °Stop all antihistamines for now, and other non-prescription cough/cold preparations. ° May take Delsym Cough Suppressant ("12 Hour Cough Relief") at bedtime for nighttime cough.  °

## 2024-01-18 NOTE — ED Triage Notes (Signed)
 Patient c/o chest and head congestion x 6 days, afebrile.  Extremely fatigue, slight cough.  Patient has taken Advil  Cold & Sinus and Mucinex.
